# Patient Record
Sex: Male | Born: 1960 | Race: Black or African American | Hispanic: No | Marital: Married
Health system: Southern US, Community
[De-identification: ages and names within clinical notes are randomized; demographics above are authoritative.]

## PROBLEM LIST (undated history)

## (undated) DIAGNOSIS — J9621 Acute and chronic respiratory failure with hypoxia: Secondary | ICD-10-CM

## (undated) DIAGNOSIS — J449 Chronic obstructive pulmonary disease, unspecified: Secondary | ICD-10-CM

## (undated) DIAGNOSIS — G9341 Metabolic encephalopathy: Secondary | ICD-10-CM

## (undated) DIAGNOSIS — G40909 Epilepsy, unspecified, not intractable, without status epilepticus: Secondary | ICD-10-CM

## (undated) DIAGNOSIS — I482 Chronic atrial fibrillation, unspecified: Secondary | ICD-10-CM

---

## 2019-10-30 ENCOUNTER — Inpatient Hospital Stay
Admission: RE | Admit: 2019-10-30 | Discharge: 2019-12-31 | Disposition: A | Discharge status: Skilled Nursing Facility | Payer: BC Managed Care – PPO | Source: Ambulatory Visit | Attending: Internal Medicine | Admitting: Internal Medicine

## 2019-10-30 DIAGNOSIS — J939 Pneumothorax, unspecified: Secondary | ICD-10-CM

## 2019-10-30 DIAGNOSIS — G9341 Metabolic encephalopathy: Secondary | ICD-10-CM | POA: Diagnosis present

## 2019-10-30 DIAGNOSIS — I482 Chronic atrial fibrillation, unspecified: Secondary | ICD-10-CM | POA: Diagnosis present

## 2019-10-30 DIAGNOSIS — J942 Hemothorax: Secondary | ICD-10-CM

## 2019-10-30 DIAGNOSIS — Z9911 Dependence on respirator [ventilator] status: Secondary | ICD-10-CM

## 2019-10-30 DIAGNOSIS — J90 Pleural effusion, not elsewhere classified: Secondary | ICD-10-CM

## 2019-10-30 DIAGNOSIS — R7989 Other specified abnormal findings of blood chemistry: Secondary | ICD-10-CM

## 2019-10-30 DIAGNOSIS — R140 Abdominal distension (gaseous): Secondary | ICD-10-CM

## 2019-10-30 DIAGNOSIS — J9621 Acute and chronic respiratory failure with hypoxia: Secondary | ICD-10-CM | POA: Diagnosis present

## 2019-10-30 DIAGNOSIS — Z431 Encounter for attention to gastrostomy: Secondary | ICD-10-CM

## 2019-10-30 DIAGNOSIS — Z4659 Encounter for fitting and adjustment of other gastrointestinal appliance and device: Secondary | ICD-10-CM

## 2019-10-30 DIAGNOSIS — Z0189 Encounter for other specified special examinations: Secondary | ICD-10-CM

## 2019-10-30 DIAGNOSIS — R1915 Other abnormal bowel sounds: Secondary | ICD-10-CM

## 2019-10-30 DIAGNOSIS — J449 Chronic obstructive pulmonary disease, unspecified: Secondary | ICD-10-CM | POA: Diagnosis present

## 2019-10-30 DIAGNOSIS — R799 Abnormal finding of blood chemistry, unspecified: Secondary | ICD-10-CM

## 2019-10-30 DIAGNOSIS — R509 Fever, unspecified: Secondary | ICD-10-CM

## 2019-10-30 DIAGNOSIS — Z95828 Presence of other vascular implants and grafts: Secondary | ICD-10-CM

## 2019-10-30 DIAGNOSIS — J9691 Respiratory failure, unspecified with hypoxia: Secondary | ICD-10-CM

## 2019-10-30 DIAGNOSIS — Z9889 Other specified postprocedural states: Secondary | ICD-10-CM

## 2019-10-30 DIAGNOSIS — J9690 Respiratory failure, unspecified, unspecified whether with hypoxia or hypercapnia: Secondary | ICD-10-CM

## 2019-10-30 DIAGNOSIS — K567 Ileus, unspecified: Secondary | ICD-10-CM

## 2019-10-30 DIAGNOSIS — Z931 Gastrostomy status: Secondary | ICD-10-CM

## 2019-10-30 DIAGNOSIS — J189 Pneumonia, unspecified organism: Secondary | ICD-10-CM

## 2019-10-30 DIAGNOSIS — J811 Chronic pulmonary edema: Secondary | ICD-10-CM

## 2019-10-30 DIAGNOSIS — G40909 Epilepsy, unspecified, not intractable, without status epilepticus: Secondary | ICD-10-CM

## 2019-10-30 DIAGNOSIS — R609 Edema, unspecified: Secondary | ICD-10-CM

## 2019-10-30 DIAGNOSIS — R188 Other ascites: Secondary | ICD-10-CM

## 2019-10-30 HISTORY — DX: Acute and chronic respiratory failure with hypoxia: J96.21

## 2019-10-30 HISTORY — DX: Epilepsy, unspecified, not intractable, without status epilepticus: G40.909

## 2019-10-30 HISTORY — DX: Chronic obstructive pulmonary disease, unspecified: J44.9

## 2019-10-30 HISTORY — DX: Chronic atrial fibrillation, unspecified: I48.20

## 2019-10-30 HISTORY — DX: Metabolic encephalopathy: G93.41

## 2019-10-31 ENCOUNTER — Other Ambulatory Visit (HOSPITAL_COMMUNITY): Payer: BC Managed Care – PPO

## 2019-10-31 LAB — BLOOD GAS, ARTERIAL
Acid-Base Excess: 2.1 mmol/L — ABNORMAL HIGH (ref 0.0–2.0)
Bicarbonate: 26 mmol/L (ref 20.0–28.0)
FIO2: 40
O2 Saturation: 97.1 %
Patient temperature: 37.5
pCO2 arterial: 40.2 mmHg (ref 32.0–48.0)
pH, Arterial: 7.429 (ref 7.350–7.450)
pO2, Arterial: 95.6 mmHg (ref 83.0–108.0)

## 2019-10-31 LAB — COMPREHENSIVE METABOLIC PANEL
ALT: 34 U/L (ref 0–44)
AST: 29 U/L (ref 15–41)
Albumin: 1.9 g/dL — ABNORMAL LOW (ref 3.5–5.0)
Alkaline Phosphatase: 63 U/L (ref 38–126)
Anion gap: 9 (ref 5–15)
BUN: 13 mg/dL (ref 6–20)
CO2: 25 mmol/L (ref 22–32)
Calcium: 7.8 mg/dL — ABNORMAL LOW (ref 8.9–10.3)
Chloride: 112 mmol/L — ABNORMAL HIGH (ref 98–111)
Creatinine, Ser: 0.84 mg/dL (ref 0.61–1.24)
GFR calc Af Amer: >60 mL/min (ref >60)
GFR calc non Af Amer: >60 mL/min (ref >60)
Glucose, Bld: 107 mg/dL — ABNORMAL HIGH (ref 70–99)
Potassium: 4.1 mmol/L (ref 3.5–5.1)
Sodium: 146 mmol/L — ABNORMAL HIGH (ref 135–145)
Total Bilirubin: 0.5 mg/dL (ref 0.3–1.2)
Total Protein: 5.8 g/dL — ABNORMAL LOW (ref 6.5–8.1)

## 2019-10-31 LAB — URINALYSIS, ROUTINE W REFLEX MICROSCOPIC
Bacteria, UA: NONE SEEN
Bilirubin Urine: NEGATIVE
Glucose, UA: NEGATIVE mg/dL
Ketones, ur: 5 mg/dL — AB
Leukocytes,Ua: NEGATIVE
Nitrite: NEGATIVE
Protein, ur: 30 mg/dL — AB
Specific Gravity, Urine: 1.02 (ref 1.005–1.030)
pH: 6 (ref 5.0–8.0)

## 2019-10-31 LAB — CBC
HCT: 32.5 % — ABNORMAL LOW (ref 39.0–52.0)
Hemoglobin: 10.1 g/dL — ABNORMAL LOW (ref 13.0–17.0)
MCH: 28.5 pg (ref 26.0–34.0)
MCHC: 31.1 g/dL (ref 30.0–36.0)
MCV: 91.5 fL (ref 80.0–100.0)
Platelets: 377 10*3/uL (ref 150–400)
RBC: 3.55 MIL/uL — ABNORMAL LOW (ref 4.22–5.81)
RDW: 13 % (ref 11.5–15.5)
WBC: 15.4 10*3/uL — ABNORMAL HIGH (ref 4.0–10.5)
nRBC: 0 % (ref 0.0–0.2)

## 2019-10-31 LAB — PROTIME-INR
INR: 1.2 (ref 0.8–1.2)
Prothrombin Time: 15.1 seconds (ref 11.4–15.2)

## 2019-10-31 LAB — APTT: aPTT: 34 seconds (ref 24–36)

## 2019-11-01 ENCOUNTER — Other Ambulatory Visit (HOSPITAL_COMMUNITY): Payer: BC Managed Care – PPO

## 2019-11-01 LAB — CBC
HCT: 31.5 % — ABNORMAL LOW (ref 39.0–52.0)
Hemoglobin: 10 g/dL — ABNORMAL LOW (ref 13.0–17.0)
MCH: 28.8 pg (ref 26.0–34.0)
MCHC: 31.7 g/dL (ref 30.0–36.0)
MCV: 90.8 fL (ref 80.0–100.0)
Platelets: 358 10*3/uL (ref 150–400)
RBC: 3.47 MIL/uL — ABNORMAL LOW (ref 4.22–5.81)
RDW: 12.9 % (ref 11.5–15.5)
WBC: 13.5 10*3/uL — ABNORMAL HIGH (ref 4.0–10.5)
nRBC: 0 % (ref 0.0–0.2)

## 2019-11-01 LAB — HEMOGLOBIN A1C
Hgb A1c MFr Bld: 6.3 % — ABNORMAL HIGH (ref 4.8–5.6)
Mean Plasma Glucose: 134.11 mg/dL

## 2019-11-01 LAB — RENAL FUNCTION PANEL
Albumin: 1.8 g/dL — ABNORMAL LOW (ref 3.5–5.0)
Anion gap: 7 (ref 5–15)
BUN: 12 mg/dL (ref 6–20)
CO2: 25 mmol/L (ref 22–32)
Calcium: 7.6 mg/dL — ABNORMAL LOW (ref 8.9–10.3)
Chloride: 111 mmol/L (ref 98–111)
Creatinine, Ser: 0.87 mg/dL (ref 0.61–1.24)
GFR calc Af Amer: >60 mL/min (ref >60)
GFR calc non Af Amer: >60 mL/min (ref >60)
Glucose, Bld: 109 mg/dL — ABNORMAL HIGH (ref 70–99)
Phosphorus: 3.2 mg/dL (ref 2.5–4.6)
Potassium: 4.1 mmol/L (ref 3.5–5.1)
Sodium: 143 mmol/L (ref 135–145)

## 2019-11-01 LAB — TSH: TSH: 1.927 u[IU]/mL (ref 0.350–4.500)

## 2019-11-01 LAB — MAGNESIUM: Magnesium: 2 mg/dL (ref 1.7–2.4)

## 2019-11-01 LAB — T4, FREE: Free T4: 1.29 ng/dL — ABNORMAL HIGH (ref 0.61–1.12)

## 2019-11-01 LAB — AMMONIA: Ammonia: 59 umol/L — ABNORMAL HIGH (ref 9–35)

## 2019-11-01 NOTE — Consult Note (Addendum)
Chief Complaint: Patient was seen in consultation today for abdominal pain; gastrostomy tube malpositioned.   Referring Physician(s): Dr. Sharyon Medicus  Supervising Physician: Gilmer Mor  Patient Status: [facility] in-patient  History of Present Illness: Connor Roach is a 59 y.o. 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 The patient was transferred from [facility] in [place] to [facility] on 10/30/19. The patient was admitted to [facility] for acute respiratory failure. He received a gastrostomy tube and a trach while at that hospital. Upon arrival to [facility] the patient had abdominal pain. CT imaging was obtained.  CT abdomen/pelvis 11/01/19: IMPRESSION: 1. Gastrostomy tube has pulled back into the abdominal wall. This could be a source of pain. No bowel obstruction. 2. Foley catheter in the bladder.  No renal hydronephrosis. 3. Bibasilar atelectasis.  Interventional Radiology has been asked to evaluate this position for repositioning of his current gastrostomy tube or possible replacement.    Allergies: Patient has no allergy information on record. Please check [facility] chart for list of allergies.   Medications: Prior to Admission medications   Not on File    Social History   Socioeconomic History  . Marital status: Married    Spouse name: Not on file  . Number of children: Not on file  . Years of education: Not on file  . Highest education level: Not on file  Occupational History  . Not on file  Tobacco Use  . Smoking status: Not on file  Substance and Sexual Activity  . Alcohol use: Not on file  . Drug use: Not on file  . Sexual activity: Not on file  Other Topics Concern  . Not on file  Social History Narrative  . Not on file   Social Determinants of Health   Financial Resource Strain:   . Difficulty of Paying Living Expenses:  Not on file  Food Insecurity:   . Worried About Programme researcher, broadcasting/film/video in the Last Year: Not on file  . Ran Out of Food in the Last Year: Not on file  Transportation Needs:   . Lack of Transportation (Medical): Not on file  . Lack of Transportation (Non-Medical): Not on file  Physical Activity:   . Days of Exercise per Week: Not on file  . Minutes of Exercise per Session: Not on file  Stress:   . Feeling of Stress : Not on file  Social Connections:   . Frequency of Communication with Friends and Family: Not on file  . Frequency of Social Gatherings with Friends and Family: Not on file  . Attends Religious Services: Not on file  . Active Member of Clubs or Organizations: Not on file  . Attends Banker Meetings: Not on file  . Marital Status: Not on file    Review of Systems: A 12 point ROS discussed and pertinent positives are indicated in the HPI above.  All other systems are negative.  Review of Systems  Unable to perform ROS: Acuity of condition    Vital Signs: Temp: 99.8, HR 96, BP 156/75, RR 30, 100% O2 Ventilator  Physical Exam Constitutional:      General: He is not in acute distress.    Appearance: He is ill-appearing.     Comments: Patient with tracheostomy and on the ventilator. He appears uncomfortable.   HENT:     Mouth/Throat:     Mouth: Mucous membranes are dry.  Pharynx: Oropharynx is clear.  Cardiovascular:     Rate and Rhythm: Normal rate and regular rhythm.     Pulses: Normal pulses.     Heart sounds: Normal heart sounds.  Pulmonary:     Breath sounds: Rhonchi present.     Comments: Tracheostomy/ventilator Abdominal:     General: Bowel sounds are normal.     Palpations: Abdomen is soft.     Comments: Gastrostomy tube in place; split gauze dressing saturated with dark-colored blood.   Skin:    General: Skin is warm and dry.  Neurological:     Mental Status: He is alert.     Comments: Unable to assess. Patient did not give any signs  that he understood what I was saying.      Imaging: CT ABDOMEN PELVIS WO CONTRAST  Result Date: 11/01/2019 CLINICAL DATA:  Epigastric pain EXAM: CT ABDOMEN AND PELVIS WITHOUT CONTRAST TECHNIQUE: Multidetector CT imaging of the abdomen and pelvis was performed following the standard protocol without IV contrast. COMPARISON:  None. FINDINGS: Lower chest: Bibasilar atelectasis posteriorly. No pleural effusion. Heart size normal. Hepatobiliary: No focal liver abnormality is seen. No gallstones, gallbladder wall thickening, or biliary dilatation. Pancreas: Negative Spleen: Negative Adrenals/Urinary Tract: Adrenal glands are unremarkable. Kidneys are normal, without renal calculi, focal lesion, or hydronephrosis. Foley catheter in bladder. Bladder empty. Stomach/Bowel: Gastrostomy tube is noted. The mushroom for the catheter is in the anterior abdominal wall. The anterior wall the stomach protrudes into the and abdominal wall. No fluid collection or edema. No bowel obstruction. Negative for bowel mass or edema. Normal appendix. Vascular/Lymphatic: Mild atherosclerotic calcification in the aorta. No aneurysm. No enlarged lymph nodes. Reproductive: Prostate not enlarged. Other: No free fluid or abscess. Musculoskeletal: Negative IMPRESSION: 1. Gastrostomy tube has pulled back into the abdominal wall. This could be a source of pain. No bowel obstruction. 2. Foley catheter in the bladder.  No renal hydronephrosis. 3. Bibasilar atelectasis. Electronically Signed   By: Marlan Palau M.D.   On: 11/01/2019 12:57   DG Chest Port 1 View  Result Date: 10/31/2019 CLINICAL DATA:  Respiratory failure EXAM: PORTABLE CHEST 1 VIEW COMPARISON:  None. FINDINGS: The heart size and mediastinal contours are within normal limits. Both lungs are clear. The visualized skeletal structures are unremarkable. Tracheostomy tube tip at the level of the clavicular heads. Linear density to the left of the tracheal air column is likely  external. IMPRESSION: No active disease. Tracheostomy tube tip at the level of the clavicular heads. Electronically Signed   By: Deatra Robinson M.D.   On: 10/31/2019 02:08   DG Abd Portable 1V  Result Date: 10/31/2019 CLINICAL DATA:  PEG tube placement EXAM: PORTABLE ABDOMEN - 1 VIEW COMPARISON:  None. FINDINGS: Contrast injected through the gastrostomy tube outlines rugal folds, the duodenum and proximal jejunum. IMPRESSION: Gastrostomy tube tip in the stomach. Electronically Signed   By: Deatra Robinson M.D.   On: 10/31/2019 02:07    Labs:  CBC: Recent Labs    10/31/19 1115 11/01/19 0720  WBC 15.4* 13.5*  HGB 10.1* 10.0*  HCT 32.5* 31.5*  PLT 377 358    COAGS: Recent Labs    10/31/19 1115  INR 1.2  APTT 34    BMP: Recent Labs    10/31/19 1115 11/01/19 0720  NA 146* 143  K 4.1 4.1  CL 112* 111  CO2 25 25  GLUCOSE 107* 109*  BUN 13 12  CALCIUM 7.8* 7.6*  CREATININE 0.84 0.87  GFRNONAA >60 >  60  GFRAA >60 >60    LIVER FUNCTION TESTS: Recent Labs    10/31/19 1115 11/01/19 0720  BILITOT 0.5  --   AST 29  --   ALT 34  --   ALKPHOS 63  --   PROT 5.8*  --   ALBUMIN 1.9* 1.8*    TUMOR MARKERS: No results for input(s): AFPTM, CEA, CA199, CHROMGRNA in the last 8760 hours.  Assessment and Plan:  Acute respiratory failure; dysphagia; malpositioned gastrostomy tube: Connor Roach, 59 year old male, is tentatively scheduled to be seen 11/02/19 at the [facility] Interventional Radiology department for repositioning of his current gastrostomy tube or possible replacement. Telephone consent obtained from patient's wife Dravon Nott.   Risks and benefits image guided gastrostomy tube repositiong or replacement were discussed with the patient's wife including, but not limited to the need for a barium enema during the procedure, bleeding, infection, peritonitis and/or damage to adjacent structures.  All of the patient's wife's questions were answered, patient's wife is  agreeable to proceed.  Bedside RN at [facility] instructed to NOT use current gastrostomy tube and to hold lovenox in preparation for IR procedure.   Consent signed and in the APP office at [facility].  Thank you for this interesting consult.  I greatly enjoyed meeting JERY HOLLERN and look forward to participating in their care.  A copy of this report was sent to the requesting provider on this date.  Electronically Signed: Alwyn Ren, AGACNP-BC [phone] 11/01/2019, 4:36 PM   I spent a total of 40 Minutes    in face to face in clinical consultation, greater than 50% of which was counseling/coordinating care for gastrostomy tube reposition or replacement.

## 2019-11-01 NOTE — Consult Note (Signed)
Infectious Disease Consultation   Connor Roach  HAL:[id number]  DOB: [birth year]  DOA: 10/30/2019  Requesting physician: Dr. Manson Passey  Reason for consultation: Antibiotic recommendations   History of Present Illness: Connor Roach is an 59 y.o. male who [facility] 10/30/[facility].  He has a medical history significant of bipolar disorder, polysubstance abuse.  He was initially admitted to the acute hospital on 10/13/2019 due to encephalopathy secondary to medication toxicity.  He was found to have acute respiratory failure with hypercapnia.  He was initially unresponsive and had to be intubated.  Patient at that time also had elevated WBC count of 26.9 and creatinine of 1.52.  Urine toxicology screen was positive for cocaine and benzodiazepines.  He was treated for possible sepsis, respiratory failure.  Per records from the outside facility he developed angioedema which was treated and he also had paroxysmal atrial fibrillation and was treated.  Patient's hospital course complicated by seizures and developed on 10/16/2019 likely secondary to withdrawal?  He was placed on seizure medications.  He had multiple electrolyte abnormalities that were corrected.  He had tracheostomy as well as PEG tube placement.  After admission here he was found to have fever and leukocytosis.  He was started on empiric IV vancomycin, cefepime.  Blood cultures, respiratory cultures pending at this time.  Patient apparently also was found to have stool leaking through his PEG tube?  CT of the abdomen and pelvis without contrast was done which showed that the gastrostomy tube pulled back into the abdominal wall.  No bowel obstruction.  Bibasilar atelectasis was noted.  He is encephalopathic.  He currently has a trach in place.  On 40% FiO2.  Review of Systems:  Unable to obtain review of systems.  Patient nonverbal, encephalopathy.  Past Medical History: Substance abuse, bipolar disorder, anxiety, COPD,  anemia, hypertension  Past Surgical History: Trach, PEG tube placement  Allergies: No known drug allergies  Social History: History of polysubstance abuse, tobacco abuse, alcohol use  Family History: Unable to obtain  Physical Exam: Vitals: Temperature 99.8, pulse 72, respiratory rate 18, blood pressure 156/75, pulse oximetry 100% Constitutional: Ill-appearing male, opening eyes but not following any commands at this time.,  Encephalopathy. Eyes: PERLA, EOMI  ENMT: external ears and nose appear normal, poor dentition Neck: Has trach in place CVS: S1-S2 Respiratory: Rhonchi, no wheezing Abdomen: Soft, has PEG tube in place with dark/black drainage from around the PEG tube, positive bowel sounds  Musculoskeletal: No edema Neuro: He is not following any commands.  Unable to do neurologic exam at this time. Psych: Unable to assess Skin: no rashes  Data reviewed:  I have personally reviewed following labs and imaging studies Labs:  CBC: Recent Labs  Lab 10/31/19 1115 11/01/19 0720  WBC 15.4* 13.5*  HGB 10.1* 10.0*  HCT 32.5* 31.5*  MCV 91.5 90.8  PLT 377 358    Basic Metabolic Panel: Recent Labs  Lab 10/31/19 1115 11/01/19 0720  NA 146* 143  K 4.1 4.1  CL 112* 111  CO2 25 25  GLUCOSE 107* 109*  BUN 13 12  CREATININE 0.84 0.87  CALCIUM 7.8* 7.6*  MG  --  2.0  PHOS  --  3.2   GFR CrCl cannot be calculated (Unknown ideal weight.). Liver Function Tests: Recent Labs  Lab 10/31/19 1115 11/01/19 0720  AST 29  --   ALT 34  --   ALKPHOS 63  --  BILITOT 0.5  --   PROT 5.8*  --   ALBUMIN 1.9* 1.8*   No results for input(s): LIPASE, AMYLASE in the last 168 hours. Recent Labs  Lab 11/01/19 0720  AMMONIA 59*   Coagulation profile Recent Labs  Lab 10/31/19 1115  INR 1.2    Cardiac Enzymes: No results for input(s): CKTOTAL, CKMB, CKMBINDEX, TROPONINI in the last 168 hours. BNP: Invalid input(s): POCBNP CBG: No results for input(s): GLUCAP in the  last 168 hours. D-Dimer No results for input(s): DDIMER in the last 72 hours. Hgb A1c Recent Labs    11/01/19 0720  HGBA1C 6.3*   Lipid Profile No results for input(s): CHOL, HDL, LDLCALC, TRIG, CHOLHDL, LDLDIRECT in the last 72 hours. Thyroid function studies Recent Labs    11/01/19 0720  TSH 1.927   Anemia work up No results for input(s): VITAMINB12, FOLATE, FERRITIN, TIBC, IRON, RETICCTPCT in the last 72 hours. Urinalysis    Component Value Date/Time   COLORURINE YELLOW 10/31/2019 1711   APPEARANCEUR CLEAR 10/31/2019 1711   LABSPEC 1.020 10/31/2019 1711   PHURINE 6.0 10/31/2019 1711   GLUCOSEU NEGATIVE 10/31/2019 1711   HGBUR MODERATE (A) 10/31/2019 1711   BILIRUBINUR NEGATIVE 10/31/2019 1711   KETONESUR 5 (A) 10/31/2019 1711   PROTEINUR 30 (A) 10/31/2019 1711   NITRITE NEGATIVE 10/31/2019 1711   LEUKOCYTESUR NEGATIVE 10/31/2019 1711     Microbiology Recent Results (from the past 240 hour(s))  Culture, respiratory (non-expectorated)     Status: None (Preliminary result)   Collection Time: 10/31/19  4:15 AM   Specimen: Tracheal Aspirate; Respiratory  Result Value Ref Range Status   Specimen Description TRACHEAL ASPIRATE  Final   Special Requests Normal  Final   Gram Stain   Final    FEW WBC PRESENT, PREDOMINANTLY PMN MODERATE GRAM POSITIVE COCCI    Culture   Final    ABUNDANT STAPHYLOCOCCUS AUREUS SUSCEPTIBILITIES TO FOLLOW Performed at [facility], [address], [place], [place] [address]    Report Status PENDING  Incomplete       Inpatient Medications:   Please see MAR   Radiological Exams on Admission: CT ABDOMEN PELVIS WO CONTRAST  Result Date: 11/01/2019 CLINICAL DATA:  Epigastric pain EXAM: CT ABDOMEN AND PELVIS WITHOUT CONTRAST TECHNIQUE: Multidetector CT imaging of the abdomen and pelvis was performed following the standard protocol without IV contrast. COMPARISON:  None. FINDINGS: Lower chest: Bibasilar atelectasis posteriorly. No  pleural effusion. Heart size normal. Hepatobiliary: No focal liver abnormality is seen. No gallstones, gallbladder wall thickening, or biliary dilatation. Pancreas: Negative Spleen: Negative Adrenals/Urinary Tract: Adrenal glands are unremarkable. Kidneys are normal, without renal calculi, focal lesion, or hydronephrosis. Foley catheter in bladder. Bladder empty. Stomach/Bowel: Gastrostomy tube is noted. The mushroom for the catheter is in the anterior abdominal wall. The anterior wall the stomach protrudes into the and abdominal wall. No fluid collection or edema. No bowel obstruction. Negative for bowel mass or edema. Normal appendix. Vascular/Lymphatic: Mild atherosclerotic calcification in the aorta. No aneurysm. No enlarged lymph nodes. Reproductive: Prostate not enlarged. Other: No free fluid or abscess. Musculoskeletal: Negative IMPRESSION: 1. Gastrostomy tube has pulled back into the abdominal wall. This could be a source of pain. No bowel obstruction. 2. Foley catheter in the bladder.  No renal hydronephrosis. 3. Bibasilar atelectasis. Electronically Signed   By: Marlan Palau M.D.   On: 11/01/2019 12:57   DG Chest Port 1 View  Result Date: 10/31/2019 CLINICAL DATA:  Respiratory failure EXAM: PORTABLE CHEST  1 VIEW COMPARISON:  None. FINDINGS: The heart size and mediastinal contours are within normal limits. Both lungs are clear. The visualized skeletal structures are unremarkable. Tracheostomy tube tip at the level of the clavicular heads. Linear density to the left of the tracheal air column is likely external. IMPRESSION: No active disease. Tracheostomy tube tip at the level of the clavicular heads. Electronically Signed   By: Deatra RobinsonKevin  Herman M.D.   On: 10/31/2019 02:08   DG Abd Portable 1V  Result Date: 10/31/2019 CLINICAL DATA:  PEG tube placement EXAM: PORTABLE ABDOMEN - 1 VIEW COMPARISON:  None. FINDINGS: Contrast injected through the gastrostomy tube outlines rugal folds, the duodenum and  proximal jejunum. IMPRESSION: Gastrostomy tube tip in the stomach. Electronically Signed   By: Deatra RobinsonKevin  Herman M.D.   On: 10/31/2019 02:07    Impression/Recommendations Acute hypoxemic/hypercapnic respiratory failure, ventilator dependent respiratory failure Systemic inflammatory response syndrome Fever/leukocytosis PEG tube site complication with leakage Atrial fibrillation Encephalopathy Seizure disorder Dysphagia Polysubstance abuse  Acute hypoxemic/hypercapnic respiratory failure: Patient currently ventilator dependent.  On 40% FiO2.  He initially was found unresponsive probably from polysubstance abuse/intoxication and had to be intubated for respiratory protection.  Currently has a trach in place.  He is high risk for aspiration and aspiration pneumonia.  Preliminary respiratory cultures showing Staphylococcus, susceptibilities still pending at this time.  Currently started on IV vancomycin, cefepime and empirically due to systemic inflammatory response syndrome fever, worsening leukocytosis.  Chest x-ray did not show any acute findings.  However, he could be having early pneumonia.  Follow-up on the final respiratory cultures and adjust antibiotics accordingly.  Systemic inflammatory response syndrome: Patient having fever, leukocytosis.  Started on empiric antibiotic treatment with IV vancomycin, cefepime.  Would also recommend to add Flagyl for anaerobic coverage.  He has some dark/black-colored material draining from around his PEG tube.  Initially thought to be fecal matter.  CT of the abdomen and pelvis showing that the PEG tube was displaced into the abdominal wall.  He is high risk for abdominal wall cellulitis/abscess.  Empiric antibiotics as mentioned above.  Continue to monitor counts closely.  He also had acute renal failure at the outside hospital likely secondary to sepsis.  Please monitor BUN/trending closely while on antibiotics.  Fever/leukocytosis: Patient noted to have  elevated WBC count, fever.  Currently started on empiric IV vancomycin, cefepime.  Blood cultures and respiratory cultures ordered.  Preliminary respiratory cultures showing Staphylococcus.  Susceptibilities still pending at this time.  Chest x-ray negative.  It is possible that he could be having early pneumonia.  There is also concern for infection around the PEG tube site with drainage.  CT abdomen imaging as mentioned above.  Recommended to add Flagyl for anaerobic coverage. Continue to monitor closely.  PEG tube site complication with leakage: Previously thought to be fecal matter.  CT of the abdomen pelvis without contrast was done with findings as mentioned above.  He appears to have dark/almost black-colored drainage from around the PEG tube.  Concern for GI bleed?  Given history of polysubstance abuse, alcohol use.  Suggest to check stool for occult blood.  He is also high risk for abdominal wall cellulitis/abscess due to the displacement of the PEG tube. Primary team consulting interventional radiology. Further management per the primary team.   Atrial fibrillation: Continue medication and management per the primary team.  Seizure: There is a mention of seizures at the outside facility could be possibly secondary to withdrawal.  However, antibiotics lower the seizure  threshold therefore monitor closely.  Continue medication for seizures per the primary team.  Encephalopathy: Continue supportive management per the primary team.  Dysphagia: Due to his dysphagia he is high risk for aspiration and aspiration pneumonia.  Polysubstance abuse: Patient is at risk for withdrawal secondary to polysubstance abuse.  He is also at risk for bacteremia/endocarditis due to his drug abuse.  Currently started on empiric antibiotics.  Blood cultures, respiratory cultures pending at this time.  Continue supportive management per the primary team.  Due to his complex medical problems he is high risk for worsening  and decompensation.  Thank you for this consultation.  Plan of care discussed with the primary team and pharmacy.  Vonzella Nipple M.D. 11/01/2019, 4:54 PM

## 2019-11-02 ENCOUNTER — Other Ambulatory Visit (HOSPITAL_COMMUNITY): Payer: BC Managed Care – PPO

## 2019-11-02 LAB — CULTURE, RESPIRATORY W GRAM STAIN: Special Requests: NORMAL

## 2019-11-02 LAB — BASIC METABOLIC PANEL
Anion gap: 10 (ref 5–15)
BUN: 12 mg/dL (ref 6–20)
CO2: 22 mmol/L (ref 22–32)
Calcium: 7.6 mg/dL — ABNORMAL LOW (ref 8.9–10.3)
Chloride: 111 mmol/L (ref 98–111)
Creatinine, Ser: 0.91 mg/dL (ref 0.61–1.24)
GFR calc Af Amer: >60 mL/min (ref >60)
GFR calc non Af Amer: >60 mL/min (ref >60)
Glucose, Bld: 91 mg/dL (ref 70–99)
Potassium: 4 mmol/L (ref 3.5–5.1)
Sodium: 143 mmol/L (ref 135–145)

## 2019-11-02 LAB — CBC
HCT: 32.1 % — ABNORMAL LOW (ref 39.0–52.0)
Hemoglobin: 9.9 g/dL — ABNORMAL LOW (ref 13.0–17.0)
MCH: 28.1 pg (ref 26.0–34.0)
MCHC: 30.8 g/dL (ref 30.0–36.0)
MCV: 91.2 fL (ref 80.0–100.0)
Platelets: 376 10*3/uL (ref 150–400)
RBC: 3.52 MIL/uL — ABNORMAL LOW (ref 4.22–5.81)
RDW: 13.1 % (ref 11.5–15.5)
WBC: 11.1 10*3/uL — ABNORMAL HIGH (ref 4.0–10.5)
nRBC: 0 % (ref 0.0–0.2)

## 2019-11-02 LAB — URINE CULTURE: Culture: NO GROWTH

## 2019-11-02 LAB — PHOSPHORUS: Phosphorus: 3.2 mg/dL (ref 2.5–4.6)

## 2019-11-02 LAB — MAGNESIUM: Magnesium: 2 mg/dL (ref 1.7–2.4)

## 2019-11-02 LAB — VALPROIC ACID LEVEL: Valproic Acid Lvl: 55 ug/mL (ref 50.0–100.0)

## 2019-11-03 ENCOUNTER — Other Ambulatory Visit (HOSPITAL_COMMUNITY): Payer: BC Managed Care – PPO

## 2019-11-03 LAB — BASIC METABOLIC PANEL
Anion gap: 11 (ref 5–15)
BUN: 17 mg/dL (ref 6–20)
CO2: 25 mmol/L (ref 22–32)
Calcium: 7.7 mg/dL — ABNORMAL LOW (ref 8.9–10.3)
Chloride: 110 mmol/L (ref 98–111)
Creatinine, Ser: 0.86 mg/dL (ref 0.61–1.24)
GFR calc Af Amer: >60 mL/min (ref >60)
GFR calc non Af Amer: >60 mL/min (ref >60)
Glucose, Bld: 92 mg/dL (ref 70–99)
Potassium: 4.3 mmol/L (ref 3.5–5.1)
Sodium: 146 mmol/L — ABNORMAL HIGH (ref 135–145)

## 2019-11-03 LAB — CBC
HCT: 35.1 % — ABNORMAL LOW (ref 39.0–52.0)
Hemoglobin: 10.7 g/dL — ABNORMAL LOW (ref 13.0–17.0)
MCH: 28.2 pg (ref 26.0–34.0)
MCHC: 30.5 g/dL (ref 30.0–36.0)
MCV: 92.6 fL (ref 80.0–100.0)
Platelets: 371 10*3/uL (ref 150–400)
RBC: 3.79 MIL/uL — ABNORMAL LOW (ref 4.22–5.81)
RDW: 13.1 % (ref 11.5–15.5)
WBC: 14.7 10*3/uL — ABNORMAL HIGH (ref 4.0–10.5)
nRBC: 0 % (ref 0.0–0.2)

## 2019-11-03 LAB — BLOOD GAS, ARTERIAL
Acid-Base Excess: 3.2 mmol/L — ABNORMAL HIGH (ref 0.0–2.0)
Bicarbonate: 27.7 mmol/L (ref 20.0–28.0)
FIO2: 40
O2 Saturation: 96.9 %
Patient temperature: 37.1
pCO2 arterial: 45.7 mmHg (ref 32.0–48.0)
pH, Arterial: 7.399 (ref 7.350–7.450)
pO2, Arterial: 88.8 mmHg (ref 83.0–108.0)

## 2019-11-03 LAB — VANCOMYCIN, TROUGH: Vancomycin Tr: 16 ug/mL (ref 15–20)

## 2019-11-03 LAB — MAGNESIUM: Magnesium: 2.2 mg/dL (ref 1.7–2.4)

## 2019-11-03 LAB — PHOSPHORUS: Phosphorus: 4.1 mg/dL (ref 2.5–4.6)

## 2019-11-04 ENCOUNTER — Encounter: Payer: Self-pay | Admitting: Internal Medicine

## 2019-11-04 ENCOUNTER — Other Ambulatory Visit (HOSPITAL_COMMUNITY): Payer: BC Managed Care – PPO

## 2019-11-04 DIAGNOSIS — I482 Chronic atrial fibrillation, unspecified: Secondary | ICD-10-CM

## 2019-11-04 DIAGNOSIS — J9621 Acute and chronic respiratory failure with hypoxia: Secondary | ICD-10-CM | POA: Diagnosis present

## 2019-11-04 DIAGNOSIS — J449 Chronic obstructive pulmonary disease, unspecified: Secondary | ICD-10-CM

## 2019-11-04 DIAGNOSIS — G9341 Metabolic encephalopathy: Secondary | ICD-10-CM | POA: Diagnosis present

## 2019-11-04 DIAGNOSIS — G40909 Epilepsy, unspecified, not intractable, without status epilepticus: Secondary | ICD-10-CM

## 2019-11-04 LAB — BASIC METABOLIC PANEL
Anion gap: 11 (ref 5–15)
BUN: 32 mg/dL — ABNORMAL HIGH (ref 6–20)
CO2: 23 mmol/L (ref 22–32)
Calcium: 7.6 mg/dL — ABNORMAL LOW (ref 8.9–10.3)
Chloride: 112 mmol/L — ABNORMAL HIGH (ref 98–111)
Creatinine, Ser: 0.98 mg/dL (ref 0.61–1.24)
GFR calc Af Amer: >60 mL/min (ref >60)
GFR calc non Af Amer: >60 mL/min (ref >60)
Glucose, Bld: 102 mg/dL — ABNORMAL HIGH (ref 70–99)
Potassium: 5.4 mmol/L — ABNORMAL HIGH (ref 3.5–5.1)
Sodium: 146 mmol/L — ABNORMAL HIGH (ref 135–145)

## 2019-11-04 LAB — CBC
HCT: 35.3 % — ABNORMAL LOW (ref 39.0–52.0)
Hemoglobin: 10.5 g/dL — ABNORMAL LOW (ref 13.0–17.0)
MCH: 29.4 pg (ref 26.0–34.0)
MCHC: 29.7 g/dL — ABNORMAL LOW (ref 30.0–36.0)
MCV: 98.9 fL (ref 80.0–100.0)
Platelets: 351 10*3/uL (ref 150–400)
RBC: 3.57 MIL/uL — ABNORMAL LOW (ref 4.22–5.81)
RDW: 13.3 % (ref 11.5–15.5)
WBC: 13.2 10*3/uL — ABNORMAL HIGH (ref 4.0–10.5)
nRBC: 0 % (ref 0.0–0.2)

## 2019-11-04 LAB — PHOSPHORUS: Phosphorus: 4.1 mg/dL (ref 2.5–4.6)

## 2019-11-04 LAB — MAGNESIUM: Magnesium: 2.5 mg/dL — ABNORMAL HIGH (ref 1.7–2.4)

## 2019-11-04 LAB — TRIGLYCERIDES: Triglycerides: 126 mg/dL (ref <150)

## 2019-11-04 NOTE — Consult Note (Addendum)
Pulmonary Critical Care Medicine [facility] [place]  PULMONARY SERVICE  Date of Service: 11/04/2019  PULMONARY CRITICAL CARE CONSULT   [name]  ZOX:[id number]  DOB: October 19, 1960   DOA: 10/30/2019  Referring Physician: Carron Curie, MD  HPI: Connor Roach is a 59 y.o. male seen for follow up of Acute on Chronic Respiratory Failure. Patient has multiple medical problems including bipolar disorder atrial fibrillation polysubstance abuse flaccid paralysis came into the hospital originally with acute respiratory failure. He had been apparently found unresponsive and was also noted to have positive cocaine and benzodiazepines. Patient was felt to be septic at the time of admission. Apparently developed angioedema atrial fibrillation. Patient also subsequently developed seizures which was felt to be possibly related to withdrawal. Patient was started on IV antibiotics. He was however not able to wean off the ventilator. Transferred to our facility for further management and weaning.  Review of Systems:  ROS performed and is unremarkable other than noted above.  Past medical history: Polysubstance abuse Bipolar disorder Atrial fibrillation Seizure disorder COPD Anxiety disorder  Past surgical history: Tracheostomy PEG  Family history: Noncontributory to the present illness  Social history: Positive for tobacco use Alcohol use Polysubstance abuse  Medications: Reviewed on Rounds  Physical Exam:  Vitals: Temperature is 98.9 pulse 77 respiratory rate 13 blood pressure is 146/68 saturations 100%  Ventilator Settings patient currently is on assist control FiO2 40% tidal volume is 850 PEEP 5  . General: Comfortable at this time . Eyes: Grossly normal lids, irises & conjunctiva . ENT: grossly tongue is normal . Neck: no obvious mass . Cardiovascular: S1-S2 normal no gallop or rub . Respiratory: No rhonchi very coarse breath sounds . Abdomen: Soft and  nontender . Skin: no rash seen on limited exam . Musculoskeletal: not rigid . Psychiatric:unable to assess . Neurologic: no seizure no involuntary movements         Labs on Admission:  Basic Metabolic Panel: Recent Labs  Lab 10/31/19 1115 11/01/19 0720 11/02/19 0548 11/03/19 0723  NA 146* 143 143 146*  K 4.1 4.1 4.0 4.3  CL 112* 111 111 110  CO2 25 25 22 25   GLUCOSE 107* 109* 91 92  BUN 13 12 12 17   CREATININE 0.84 0.87 0.91 0.86  CALCIUM 7.8* 7.6* 7.6* 7.7*  MG  --  2.0 2.0 2.2  PHOS  --  3.2 3.2 4.1    Recent Labs  Lab 10/31/19 0030 11/03/19 0302  PHART 7.429 7.399  PCO2ART 40.2 45.7  PO2ART 95.6 88.8  HCO3 26.0 27.7  O2SAT 97.1 96.9    Liver Function Tests: Recent Labs  Lab 10/31/19 1115 11/01/19 0720  AST 29  --   ALT 34  --   ALKPHOS 63  --   BILITOT 0.5  --   PROT 5.8*  --   ALBUMIN 1.9* 1.8*   No results for input(s): LIPASE, AMYLASE in the last 168 hours. Recent Labs  Lab 11/01/19 0720  AMMONIA 59*    CBC: Recent Labs  Lab 10/31/19 1115 11/01/19 0720 11/02/19 0548 11/03/19 0723  WBC 15.4* 13.5* 11.1* 14.7*  HGB 10.1* 10.0* 9.9* 10.7*  HCT 32.5* 31.5* 32.1* 35.1*  MCV 91.5 90.8 91.2 92.6  PLT 377 358 376 371    Cardiac Enzymes: No results for input(s): CKTOTAL, CKMB, CKMBINDEX, TROPONINI in the last 168 hours.  BNP (last 3 results) No results for input(s): BNP in the last 8760 hours.  ProBNP (last 3 results) No results for  input(s): PROBNP in the last 8760 hours.   Radiological Exams on Admission: CT ABDOMEN PELVIS WO CONTRAST  Result Date: 11/01/2019 CLINICAL DATA:  Epigastric pain EXAM: CT ABDOMEN AND PELVIS WITHOUT CONTRAST TECHNIQUE: Multidetector CT imaging of the abdomen and pelvis was performed following the standard protocol without IV contrast. COMPARISON:  None. FINDINGS: Lower chest: Bibasilar atelectasis posteriorly. No pleural effusion. Heart size normal. Hepatobiliary: No focal liver abnormality is seen. No  gallstones, gallbladder wall thickening, or biliary dilatation. Pancreas: Negative Spleen: Negative Adrenals/Urinary Tract: Adrenal glands are unremarkable. Kidneys are normal, without renal calculi, focal lesion, or hydronephrosis. Foley catheter in bladder. Bladder empty. Stomach/Bowel: Gastrostomy tube is noted. The mushroom for the catheter is in the anterior abdominal wall. The anterior wall the stomach protrudes into the and abdominal wall. No fluid collection or edema. No bowel obstruction. Negative for bowel mass or edema. Normal appendix. Vascular/Lymphatic: Mild atherosclerotic calcification in the aorta. No aneurysm. No enlarged lymph nodes. Reproductive: Prostate not enlarged. Other: No free fluid or abscess. Musculoskeletal: Negative IMPRESSION: 1. Gastrostomy tube has pulled back into the abdominal wall. This could be a source of pain. No bowel obstruction. 2. Foley catheter in the bladder.  No renal hydronephrosis. 3. Bibasilar atelectasis. Electronically Signed   By: Marlan Palau M.D.   On: 11/01/2019 12:57   DG Abd 1 View  Result Date: 11/02/2019 CLINICAL DATA:  NG tube placement EXAM: ABDOMEN - 1 VIEW COMPARISON:  October 31, 2019 FINDINGS: A PEG tube is identified, unchanged since October 31, 2019. An OG tube is been placed in the interval and terminates in the right mid abdomen, either in the distal stomach or proximal duodenum. Contrast is seen throughout the length of the colon. No other acute abnormalities. IMPRESSION: 1. A PEG tube is again noted as above. 2. A new OG tube is identified. The distal tip is in the region of the distal stomach or proximal duodenum. Electronically Signed   By: Gerome Sam III M.D   On: 11/02/2019 15:00   DG CHEST PORT 1 VIEW  Result Date: 11/04/2019 CLINICAL DATA:  Pneumonia. EXAM: PORTABLE CHEST 1 VIEW COMPARISON:  11/03/2018 FINDINGS: Tracheostomy tube tip is stable above the carina. NG tube tip is below the GE junction. Normal heart size. No  pleural effusion or edema. No change in aeration to lungs compared with previous exam. IMPRESSION: No change in aeration to the lungs compared with previous exam. Stable tracheostomy tube and NG tube. Electronically Signed   By: Signa Kell M.D.   On: 11/04/2019 07:39   DG CHEST PORT 1 VIEW  Result Date: 11/03/2019 CLINICAL DATA:  Evaluate tracheostomy tube tip. EXAM: PORTABLE CHEST 1 VIEW COMPARISON:  10/31/2019 FINDINGS: Satisfactory position of tracheostomy tube with tip above the carina. There is a nasogastric tube with tip below the level of the GE junction. Heart size appears within normal limits. No pleural effusion or edema identified. No airspace densities. IMPRESSION: Satisfactory position of tracheostomy tube with tip above the carina. Electronically Signed   By: Signa Kell M.D.   On: 11/03/2019 08:57    Assessment/Plan Active Problems:   Acute on chronic respiratory failure with hypoxia (HCC)   Chronic atrial fibrillation (HCC)   Seizure disorder (HCC)   Metabolic encephalopathy   COPD, severe (HCC)   1. Acute on chronic respiratory failure with hypoxia we will continue with full support right now patient is on assist control. Wean protocol has been started 2. Atrial fibrillation rate now rate controlled we will  continue with supportive care. 3. Seizure disorder no active seizure noted at this time we will continue to monitor. 4. Severe COPD medical management nebulizers as necessary. 5. Metabolic encephalopathy we will continue to monitor closely.  I have personally seen and evaluated the patient, evaluated laboratory and imaging results, formulated the assessment and plan and placed orders. The Patient requires high complexity decision making with multiple systems involvement.  Case was discussed on Rounds with the Respiratory Therapy Director and the Respiratory staff Time Spent  Yevonne Pax, MD [facility] Pulmonary Critical Care Medicine Sleep Medicine

## 2019-11-05 ENCOUNTER — Other Ambulatory Visit (HOSPITAL_COMMUNITY): Payer: BC Managed Care – PPO

## 2019-11-05 DIAGNOSIS — J449 Chronic obstructive pulmonary disease, unspecified: Secondary | ICD-10-CM | POA: Diagnosis not present

## 2019-11-05 DIAGNOSIS — J9621 Acute and chronic respiratory failure with hypoxia: Secondary | ICD-10-CM | POA: Diagnosis not present

## 2019-11-05 DIAGNOSIS — J9620 Acute and chronic respiratory failure, unspecified whether with hypoxia or hypercapnia: Secondary | ICD-10-CM

## 2019-11-05 DIAGNOSIS — I482 Chronic atrial fibrillation, unspecified: Secondary | ICD-10-CM | POA: Diagnosis not present

## 2019-11-05 DIAGNOSIS — G9341 Metabolic encephalopathy: Secondary | ICD-10-CM | POA: Diagnosis not present

## 2019-11-05 LAB — CBC
HCT: 27.9 % — ABNORMAL LOW (ref 39.0–52.0)
Hemoglobin: 8.7 g/dL — ABNORMAL LOW (ref 13.0–17.0)
MCH: 29.5 pg (ref 26.0–34.0)
MCHC: 31.2 g/dL (ref 30.0–36.0)
MCV: 94.6 fL (ref 80.0–100.0)
Platelets: 270 10*3/uL (ref 150–400)
RBC: 2.95 MIL/uL — ABNORMAL LOW (ref 4.22–5.81)
RDW: 13.6 % (ref 11.5–15.5)
WBC: 14 10*3/uL — ABNORMAL HIGH (ref 4.0–10.5)
nRBC: 0 % (ref 0.0–0.2)

## 2019-11-05 LAB — RENAL FUNCTION PANEL
Albumin: 1.6 g/dL — ABNORMAL LOW (ref 3.5–5.0)
Anion gap: 12 (ref 5–15)
BUN: 51 mg/dL — ABNORMAL HIGH (ref 6–20)
CO2: 21 mmol/L — ABNORMAL LOW (ref 22–32)
Calcium: 7.6 mg/dL — ABNORMAL LOW (ref 8.9–10.3)
Chloride: 112 mmol/L — ABNORMAL HIGH (ref 98–111)
Creatinine, Ser: 1.2 mg/dL (ref 0.61–1.24)
GFR calc Af Amer: >60 mL/min (ref >60)
GFR calc non Af Amer: >60 mL/min (ref >60)
Glucose, Bld: 130 mg/dL — ABNORMAL HIGH (ref 70–99)
Phosphorus: 4.5 mg/dL (ref 2.5–4.6)
Potassium: 4.2 mmol/L (ref 3.5–5.1)
Sodium: 145 mmol/L (ref 135–145)

## 2019-11-05 LAB — LEVETIRACETAM LEVEL: Levetiracetam Lvl: 17.1 ug/mL (ref 10.0–40.0)

## 2019-11-05 LAB — PREPARE RBC (CROSSMATCH)

## 2019-11-05 LAB — MAGNESIUM: Magnesium: 2.6 mg/dL — ABNORMAL HIGH (ref 1.7–2.4)

## 2019-11-05 LAB — ABO/RH: ABO/RH(D): B POS

## 2019-11-05 MED ORDER — LIDOCAINE VISCOUS HCL 2 % MT SOLN
OROMUCOSAL | Status: AC
  Filled 2019-11-05: qty 15

## 2019-11-05 NOTE — Progress Notes (Signed)
Referring Physician(s): Dr. Sharyon Medicus  Supervising Physician: Irish Lack  Patient Status:  [facility] - inpatient   Chief Complaint: Gastrostomy tube displacement  Subjective: Interventional Radiology consulted 11/01/19 for assistance with repositioning/replacing current gastrostomy tube which has been pulled into the the abdominal wall.  Patient was scheduled to be seen in IR 11/02/19 for this procedure but he spiked a fever during the night and had to be placed on a cooling blanket. The procedure was postponed and a cortrak was placed for nutrition. The patient continued to have large amount of drainage from around gastrostomy tube, causing diffuse excoriation/cellulitis.   Allergies: Patient has no allergy information on record.  Medications: Prior to Admission medications   Not on File     Vital Signs: Temp: 98.2. HR 78. BP 95/54. RR 16. O2 100% on Ventilator.   Physical Exam Constitutional:      General: He is not in acute distress.    Appearance: He is ill-appearing.     Comments: Eyes open. Minimally responsive to physical and verbal stimuli.   Cardiovascular:     Rate and Rhythm: Normal rate and regular rhythm.  Pulmonary:     Comments: Tracheostomy/ventilator Abdominal:     Comments: Gastrostomy tube with white bumper visible. Copious dark brown drainage saturating ABD pads/gauze. Surrounding skin around the gastrostomy tube site and a large portion of abdominal skin red/exoriated.   Skin:    General: Skin is warm and dry.  Neurological:     Comments: Unable to assess      Imaging: DG Abd 1 View  Result Date: 11/02/2019 CLINICAL DATA:  NG tube placement EXAM: ABDOMEN - 1 VIEW COMPARISON:  October 31, 2019 FINDINGS: A PEG tube is identified, unchanged since October 31, 2019. An OG tube is been placed in the interval and terminates in the right mid abdomen, either in the distal stomach or proximal duodenum. Contrast is seen throughout the  length of the colon. No other acute abnormalities. IMPRESSION: 1. A PEG tube is again noted as above. 2. A new OG tube is identified. The distal tip is in the region of the distal stomach or proximal duodenum. Electronically Signed   By: Gerome Sam III M.D   On: 11/02/2019 15:00   DG CHEST PORT 1 VIEW  Result Date: 11/04/2019 CLINICAL DATA:  Pneumonia. EXAM: PORTABLE CHEST 1 VIEW COMPARISON:  11/03/2018 FINDINGS: Tracheostomy tube tip is stable above the carina. NG tube tip is below the GE junction. Normal heart size. No pleural effusion or edema. No change in aeration to lungs compared with previous exam. IMPRESSION: No change in aeration to the lungs compared with previous exam. Stable tracheostomy tube and NG tube. Electronically Signed   By: Signa Kell M.D.   On: 11/04/2019 07:39   DG CHEST PORT 1 VIEW  Result Date: 11/03/2019 CLINICAL DATA:  Evaluate tracheostomy tube tip. EXAM: PORTABLE CHEST 1 VIEW COMPARISON:  10/31/2019 FINDINGS: Satisfactory position of tracheostomy tube with tip above the carina. There is a nasogastric tube with tip below the level of the GE junction. Heart size appears within normal limits. No pleural effusion or edema identified. No airspace densities. IMPRESSION: Satisfactory position of tracheostomy tube with tip above the carina. Electronically Signed   By: Signa Kell M.D.   On: 11/03/2019 08:57    Labs:  CBC: Recent Labs    11/02/19 0548 11/03/19 0723 11/04/19 1048 11/05/19 0740  WBC 11.1* 14.7* 13.2* 14.0*  HGB 9.9* 10.7* 10.5* 8.7*  HCT 32.1* 35.1* 35.3* 27.9*  PLT 376 371 351 270    COAGS: Recent Labs    10/31/19 1115  INR 1.2  APTT 34    BMP: Recent Labs    11/02/19 0548 11/03/19 0723 11/04/19 1048 11/05/19 0740  NA 143 146* 146* 145  K 4.0 4.3 5.4* 4.2  CL 111 110 112* 112*  CO2 22 25 23  21*  GLUCOSE 91 92 102* 130*  BUN 12 17 32* 51*  CALCIUM 7.6* 7.7* 7.6* 7.6*  CREATININE 0.91 0.86 0.98 1.20  GFRNONAA >60 >60 >60  >60  GFRAA >60 >60 >60 >60    LIVER FUNCTION TESTS: Recent Labs    10/31/19 1115 11/01/19 0720 11/05/19 0740  BILITOT 0.5  --   --   AST 29  --   --   ALT 34  --   --   ALKPHOS 63  --   --   PROT 5.8*  --   --   ALBUMIN 1.9* 1.8* 1.6*    Assessment and Plan:  Gastrostomy tube displaced: Tube pulled out of stomach and into the abdominal wall area. The drainage has eroded the gastrostomy site and a large portion of the abdominal skin. The white bumper of the gastrostomy tube was clearly visible. Viscous lidocaine applied and gastrostomy tube was easily removed. Patient tolerated procedure well. When the skin on the abdomen has healed a new order for gastrostomy tube can be placed and IR will evaluate the patient at that time.  IR will sign off. Please call IR with any questions.   Electronically Signed: 11/07/19, AGACNP-BC [phone] 11/05/2019, 3:07 PM   I spent a total of 25 Minutes at the the patient's bedside AND on the patient's hospital floor or unit, greater than 50% of which was counseling/coordinating care for gastrostomy tube evaluation/removal.

## 2019-11-05 NOTE — Progress Notes (Signed)
[name]   PULMONARY CRITICAL CARE SERVICE  PROGRESS NOTE  Date of Service: 11/05/2019  LEMONTE Roach  MGN:[id number]  DOB: Sep 09, 1960   DOA: 10/30/2019  Referring Physician: Carron Curie, MD  HPI: Connor Roach is a 59 y.o. male seen for follow up of Acute on Chronic Respiratory Failure.  Remains on the ventilator and full support.  Not tolerating weaning at this time.  Patient has been on 40% FiO2  Medications: Reviewed on Rounds  Physical Exam:  Vitals: Temperature 98.9 pulse 79 respiratory rate 25 blood pressure is 135/71 saturations 99%  Ventilator Settings on assist control FiO2 40% tidal volume is 400 PEEP 5  . General: Comfortable at this time . Eyes: Grossly normal lids, irises & conjunctiva . ENT: grossly tongue is normal . Neck: no obvious mass . Cardiovascular: S1 S2 normal no gallop . Respiratory: No rhonchi very coarse breath sounds . Abdomen: soft . Skin: no rash seen on limited exam . Musculoskeletal: not rigid . Psychiatric:unable to assess . Neurologic: no seizure no involuntary movements         Lab Data:   Basic Metabolic Panel: Recent Labs  Lab 11/01/19 0720 11/02/19 0548 11/03/19 0723 11/04/19 1048 11/05/19 0740  NA 143 143 146* 146* 145  K 4.1 4.0 4.3 5.4* 4.2  CL 111 111 110 112* 112*  CO2 25 22 25 23  21*  GLUCOSE 109* 91 92 102* 130*  BUN 12 12 17  32* 51*  CREATININE 0.87 0.91 0.86 0.98 1.20  CALCIUM 7.6* 7.6* 7.7* 7.6* 7.6*  MG 2.0 2.0 2.2 2.5* 2.6*  PHOS 3.2 3.2 4.1 4.1 4.5    ABG: Recent Labs  Lab 10/31/19 0030 11/03/19 0302  PHART 7.429 7.399  PCO2ART 40.2 45.7  PO2ART 95.6 88.8  HCO3 26.0 27.7  O2SAT 97.1 96.9    Liver Function Tests: Recent Labs  Lab 10/31/19 1115 11/01/19 0720 11/05/19 0740  AST 29  --   --   ALT 34  --   --   ALKPHOS 63  --   --   BILITOT 0.5  --   --   PROT 5.8*  --   --   ALBUMIN 1.9* 1.8* 1.6*   No results for input(s):  LIPASE, AMYLASE in the last 168 hours. Recent Labs  Lab 11/01/19 0720  AMMONIA 59*    CBC: Recent Labs  Lab 11/01/19 0720 11/02/19 0548 11/03/19 0723 11/04/19 1048 11/05/19 0740  WBC 13.5* 11.1* 14.7* 13.2* 14.0*  HGB 10.0* 9.9* 10.7* 10.5* 8.7*  HCT 31.5* 32.1* 35.1* 35.3* 27.9*  MCV 90.8 91.2 92.6 98.9 94.6  PLT 358 376 371 351 270    Cardiac Enzymes: No results for input(s): CKTOTAL, CKMB, CKMBINDEX, TROPONINI in the last 168 hours.  BNP (last 3 results) No results for input(s): BNP in the last 8760 hours.  ProBNP (last 3 results) No results for input(s): PROBNP in the last 8760 hours.  Radiological Exams: DG CHEST PORT 1 VIEW  Result Date: 11/04/2019 CLINICAL DATA:  Pneumonia. EXAM: PORTABLE CHEST 1 VIEW COMPARISON:  11/03/2018 FINDINGS: Tracheostomy tube tip is stable above the carina. NG tube tip is below the GE junction. Normal heart size. No pleural effusion or edema. No change in aeration to lungs compared with previous exam. IMPRESSION: No change in aeration to the lungs compared with previous exam. Stable tracheostomy tube and NG tube. Electronically Signed   By: 11/06/2019 M.D.   On: 11/04/2019 07:39  Assessment/Plan Active Problems:   Acute on chronic respiratory failure with hypoxia (HCC)   Chronic atrial fibrillation (HCC)   Seizure disorder (HCC)   Metabolic encephalopathy   COPD, severe (HCC)   1. Acute on chronic respiratory failure with hypoxia we will continue with full support on assist control right now requiring 40% FiO2.  Patient's mechanics have been poor not able to do any weaning. 2. Chronic atrial fibrillation rate is controlled 3. Seizure disorder no active seizures 4. Metabolic encephalopathy grossly unchanged 5. Severe COPD at baseline   I have personally seen and evaluated the patient, evaluated laboratory and imaging results, formulated the assessment and plan and placed orders. The Patient requires high complexity decision  making with multiple systems involvement.  Rounds were done with the Respiratory Therapy Director and Staff therapists and discussed with nursing staff also.  Yevonne Pax, MD [facility] Pulmonary Critical Care Medicine Sleep Medicine

## 2019-11-06 ENCOUNTER — Other Ambulatory Visit (HOSPITAL_COMMUNITY): Payer: BC Managed Care – PPO

## 2019-11-06 DIAGNOSIS — J9621 Acute and chronic respiratory failure with hypoxia: Secondary | ICD-10-CM | POA: Diagnosis not present

## 2019-11-06 DIAGNOSIS — J449 Chronic obstructive pulmonary disease, unspecified: Secondary | ICD-10-CM | POA: Diagnosis not present

## 2019-11-06 DIAGNOSIS — G9341 Metabolic encephalopathy: Secondary | ICD-10-CM | POA: Diagnosis not present

## 2019-11-06 DIAGNOSIS — I482 Chronic atrial fibrillation, unspecified: Secondary | ICD-10-CM | POA: Diagnosis not present

## 2019-11-06 LAB — CBC
HCT: 25.4 % — ABNORMAL LOW (ref 39.0–52.0)
Hemoglobin: 7.8 g/dL — ABNORMAL LOW (ref 13.0–17.0)
MCH: 29.1 pg (ref 26.0–34.0)
MCHC: 30.7 g/dL (ref 30.0–36.0)
MCV: 94.8 fL (ref 80.0–100.0)
Platelets: 209 10*3/uL (ref 150–400)
RBC: 2.68 MIL/uL — ABNORMAL LOW (ref 4.22–5.81)
RDW: 13.9 % (ref 11.5–15.5)
WBC: 16.4 10*3/uL — ABNORMAL HIGH (ref 4.0–10.5)
nRBC: 0.1 % (ref 0.0–0.2)

## 2019-11-06 LAB — VANCOMYCIN, TROUGH: Vancomycin Tr: 25 ug/mL (ref 15–20)

## 2019-11-06 LAB — CULTURE, BLOOD (ROUTINE X 2)
Culture: NO GROWTH
Culture: NO GROWTH

## 2019-11-06 NOTE — Progress Notes (Signed)
Pulmonary Critical Care Medicine [facility] [place]   PULMONARY CRITICAL CARE SERVICE  PROGRESS NOTE  Date of Service: 11/06/2019  Connor Roach  LYY:[id number]  DOB: 10-10-1960   DOA: 10/30/2019  Referring Physician: Carron Curie, MD  HPI: Connor Roach is a 59 y.o. male seen for follow up of Acute on Chronic Respiratory Failure.  Patient currently is on assist control mode has been on 30% FiO2 with good saturations.  Secretions are fair to moderate.  Patient still not able to tolerate spontaneous breathing trials  Medications: Reviewed on Rounds  Physical Exam:  Vitals: Temperature is 98.3 pulse 75 respiratory rate 16 blood pressure is 107/56 saturations 98%  Ventilator Settings on assist control FiO2 30% tidal volume is 480 PEEP 5  . General: Comfortable at this time . Eyes: Grossly normal lids, irises & conjunctiva . ENT: grossly tongue is normal . Neck: no obvious mass . Cardiovascular: S1 S2 normal no gallop . Respiratory: No rhonchi no rales . Abdomen: soft . Skin: no rash seen on limited exam . Musculoskeletal: not rigid . Psychiatric:unable to assess . Neurologic: no seizure no involuntary movements         Lab Data:   Basic Metabolic Panel: Recent Labs  Lab 11/01/19 0720 11/02/19 0548 11/03/19 0723 11/04/19 1048 11/05/19 0740  NA 143 143 146* 146* 145  K 4.1 4.0 4.3 5.4* 4.2  CL 111 111 110 112* 112*  CO2 25 22 25 23  21*  GLUCOSE 109* 91 92 102* 130*  BUN 12 12 17  32* 51*  CREATININE 0.87 0.91 0.86 0.98 1.20  CALCIUM 7.6* 7.6* 7.7* 7.6* 7.6*  MG 2.0 2.0 2.2 2.5* 2.6*  PHOS 3.2 3.2 4.1 4.1 4.5    ABG: Recent Labs  Lab 10/31/19 0030 11/03/19 0302  PHART 7.429 7.399  PCO2ART 40.2 45.7  PO2ART 95.6 88.8  HCO3 26.0 27.7  O2SAT 97.1 96.9    Liver Function Tests: Recent Labs  Lab 10/31/19 1115 11/01/19 0720 11/05/19 0740  AST 29  --   --   ALT 34  --   --   ALKPHOS 63  --   --   BILITOT 0.5  --   --   PROT 5.8*  --   --    ALBUMIN 1.9* 1.8* 1.6*   No results for input(s): LIPASE, AMYLASE in the last 168 hours. Recent Labs  Lab 11/01/19 0720  AMMONIA 59*    CBC: Recent Labs  Lab 11/02/19 0548 11/03/19 0723 11/04/19 1048 11/05/19 0740 11/06/19 0927  WBC 11.1* 14.7* 13.2* 14.0* 16.4*  HGB 9.9* 10.7* 10.5* 8.7* 7.8*  HCT 32.1* 35.1* 35.3* 27.9* 25.4*  MCV 91.2 92.6 98.9 94.6 94.8  PLT 376 371 351 270 209    Cardiac Enzymes: No results for input(s): CKTOTAL, CKMB, CKMBINDEX, TROPONINI in the last 168 hours.  BNP (last 3 results) No results for input(s): BNP in the last 8760 hours.  ProBNP (last 3 results) No results for input(s): PROBNP in the last 8760 hours.  Radiological Exams: DG Abd 1 View  Result Date: 11/06/2019 CLINICAL DATA:  Abdominal distension. EXAM: ABDOMEN - 1 VIEW COMPARISON:  11/02/2019 FINDINGS: Nasogastric tube is present with tip just right of midline likely over the distal stomach. Air is present throughout the colon with several prominent colonic loops as the transverse colon measures 8.4 cm in diameter. Contrast is present over the rectosigmoid colon from recent CT scan. Small caliber rectal catheter is present. No free peritoneal air. No dilated  small bowel loops. Remainder of the exam is unchanged. IMPRESSION: 1. Air throughout the colon with a few prominent colonic loops likely colonic ileus. 2. Nasogastric tube with tip just right of midline likely over the distal stomach. Electronically Signed   By: Elberta Fortis M.D.   On: 11/06/2019 14:23    Assessment/Plan Active Problems:   Acute on chronic respiratory failure with hypoxia (HCC)   Chronic atrial fibrillation (HCC)   Seizure disorder (HCC)   Metabolic encephalopathy   COPD, severe (HCC)   1. Acute on chronic respiratory failure hypoxia we will continue with full support on the ventilator not tolerating weaning. 2. Chronic atrial fibrillation rate is controlled we will monitor 3. Seizure disorder no active  seizure 4. Metabolic encephalopathy at baseline 5. Severe COPD at baseline we will monitor   I have personally seen and evaluated the patient, evaluated laboratory and imaging results, formulated the assessment and plan and placed orders. The Patient requires high complexity decision making with multiple systems involvement.  Rounds were done with the Respiratory Therapy Director and Staff therapists and discussed with nursing staff also.  Yevonne Pax, MD [facility] Pulmonary Critical Care Medicine Sleep Medicine

## 2019-11-07 ENCOUNTER — Encounter (HOSPITAL_BASED_OUTPATIENT_CLINIC_OR_DEPARTMENT_OTHER): Payer: BC Managed Care – PPO

## 2019-11-07 DIAGNOSIS — I482 Chronic atrial fibrillation, unspecified: Secondary | ICD-10-CM | POA: Diagnosis not present

## 2019-11-07 DIAGNOSIS — G9341 Metabolic encephalopathy: Secondary | ICD-10-CM | POA: Diagnosis not present

## 2019-11-07 DIAGNOSIS — R609 Edema, unspecified: Secondary | ICD-10-CM

## 2019-11-07 DIAGNOSIS — J9621 Acute and chronic respiratory failure with hypoxia: Secondary | ICD-10-CM | POA: Diagnosis not present

## 2019-11-07 DIAGNOSIS — J449 Chronic obstructive pulmonary disease, unspecified: Secondary | ICD-10-CM | POA: Diagnosis not present

## 2019-11-07 LAB — CBC
HCT: 25.5 % — ABNORMAL LOW (ref 39.0–52.0)
Hemoglobin: 8.2 g/dL — ABNORMAL LOW (ref 13.0–17.0)
MCH: 29.3 pg (ref 26.0–34.0)
MCHC: 32.2 g/dL (ref 30.0–36.0)
MCV: 91.1 fL (ref 80.0–100.0)
Platelets: 195 10*3/uL (ref 150–400)
RBC: 2.8 MIL/uL — ABNORMAL LOW (ref 4.22–5.81)
RDW: 14.5 % (ref 11.5–15.5)
WBC: 11.5 10*3/uL — ABNORMAL HIGH (ref 4.0–10.5)
nRBC: 0.2 % (ref 0.0–0.2)

## 2019-11-07 LAB — BASIC METABOLIC PANEL
Anion gap: 10 (ref 5–15)
BUN: 29 mg/dL — ABNORMAL HIGH (ref 6–20)
CO2: 23 mmol/L (ref 22–32)
Calcium: 7.3 mg/dL — ABNORMAL LOW (ref 8.9–10.3)
Chloride: 115 mmol/L — ABNORMAL HIGH (ref 98–111)
Creatinine, Ser: 1.26 mg/dL — ABNORMAL HIGH (ref 0.61–1.24)
GFR calc Af Amer: >60 mL/min (ref >60)
GFR calc non Af Amer: >60 mL/min (ref >60)
Glucose, Bld: 118 mg/dL — ABNORMAL HIGH (ref 70–99)
Potassium: 2.7 mmol/L — CL (ref 3.5–5.1)
Sodium: 148 mmol/L — ABNORMAL HIGH (ref 135–145)

## 2019-11-07 LAB — VANCOMYCIN, TROUGH: Vancomycin Tr: 9 ug/mL — ABNORMAL LOW (ref 15–20)

## 2019-11-07 LAB — AMMONIA: Ammonia: 53 umol/L — ABNORMAL HIGH (ref 9–35)

## 2019-11-07 LAB — PHOSPHORUS: Phosphorus: 2.3 mg/dL — ABNORMAL LOW (ref 2.5–4.6)

## 2019-11-07 LAB — MAGNESIUM: Magnesium: 2.4 mg/dL (ref 1.7–2.4)

## 2019-11-07 NOTE — Progress Notes (Signed)
Pulmonary Critical Care Medicine [facility] [place]   PULMONARY CRITICAL CARE SERVICE  PROGRESS NOTE  Date of Service: 11/07/2019  Connor Roach  ERX:[id number]  DOB: 1961-02-10   DOA: 10/30/2019  Referring Physician: Carron Curie, MD  HPI: Connor Roach is a 59 y.o. male seen for follow up of Acute on Chronic Respiratory Failure.  Patient currently is on assist control full support not tolerating weaning which was attempted.  Medications: Reviewed on Rounds  Physical Exam:  Vitals: Temperature 99.1 pulse 72 respiratory 21 blood pressure is 120/60 saturations 98%  Ventilator Settings on assist control FiO2 is 28% tidal volume 480 with a PEEP of 5   General: Comfortable at this time  Eyes: Grossly normal lids, irises & conjunctiva  ENT: grossly tongue is normal  Neck: no obvious mass  Cardiovascular: S1 S2 normal no gallop  Respiratory: No rhonchi no rales are noted at this time  Abdomen: soft  Skin: no rash seen on limited exam  Musculoskeletal: not rigid  Psychiatric:unable to assess  Neurologic: no seizure no involuntary movements         Lab Data:   Basic Metabolic Panel: Recent Labs  Lab 11/01/19 0720 11/02/19 0548 11/03/19 0723 11/04/19 1048 11/05/19 0740  NA 143 143 146* 146* 145  K 4.1 4.0 4.3 5.4* 4.2  CL 111 111 110 112* 112*  CO2 25 22 25 23  21*  GLUCOSE 109* 91 92 102* 130*  BUN 12 12 17  32* 51*  CREATININE 0.87 0.91 0.86 0.98 1.20  CALCIUM 7.6* 7.6* 7.7* 7.6* 7.6*  MG 2.0 2.0 2.2 2.5* 2.6*  PHOS 3.2 3.2 4.1 4.1 4.5    ABG: Recent Labs  Lab 11/03/19 0302  PHART 7.399  PCO2ART 45.7  PO2ART 88.8  HCO3 27.7  O2SAT 96.9    Liver Function Tests: Recent Labs  Lab 11/01/19 0720 11/05/19 0740  ALBUMIN 1.8* 1.6*   No results for input(s): LIPASE, AMYLASE in the last 168 hours. Recent Labs  Lab 11/01/19 0720  AMMONIA 59*    CBC: Recent Labs  Lab 11/02/19 0548 11/03/19 0723 11/04/19 1048 11/05/19 0740  11/06/19 0927  WBC 11.1* 14.7* 13.2* 14.0* 16.4*  HGB 9.9* 10.7* 10.5* 8.7* 7.8*  HCT 32.1* 35.1* 35.3* 27.9* 25.4*  MCV 91.2 92.6 98.9 94.6 94.8  PLT 376 371 351 270 209    Cardiac Enzymes: No results for input(s): CKTOTAL, CKMB, CKMBINDEX, TROPONINI in the last 168 hours.  BNP (last 3 results) No results for input(s): BNP in the last 8760 hours.  ProBNP (last 3 results) No results for input(s): PROBNP in the last 8760 hours.  Radiological Exams: DG Abd 1 View  Result Date: 11/06/2019 CLINICAL DATA:  Abdominal distension. EXAM: ABDOMEN - 1 VIEW COMPARISON:  11/02/2019 FINDINGS: Nasogastric tube is present with tip just right of midline likely over the distal stomach. Air is present throughout the colon with several prominent colonic loops as the transverse colon measures 8.4 cm in diameter. Contrast is present over the rectosigmoid colon from recent CT scan. Small caliber rectal catheter is present. No free peritoneal air. No dilated small bowel loops. Remainder of the exam is unchanged. IMPRESSION: 1. Air throughout the colon with a few prominent colonic loops likely colonic ileus. 2. Nasogastric tube with tip just right of midline likely over the distal stomach. Electronically Signed   By: 11/08/2019 M.D.   On: 11/06/2019 14:23   VAS Elberta Fortis UPPER EXTREMITY VENOUS DUPLEX  Result Date: 11/07/2019 UPPER VENOUS  STUDY  Indications: Edema Limitations: Trach collar. Comparison Study: No prior study Performing Technologist: Gertie Fey MHA, RDMS, RVT, RDCS  Examination Guidelines: A complete evaluation includes B-mode imaging, spectral Doppler, color Doppler, and power Doppler as needed of all accessible portions of each vessel. Bilateral testing is considered an integral part of a complete examination. Limited examinations for reoccurring indications may be performed as noted.  Right Findings: +----------+------------+---------+-----------+----------+--------------+  RIGHT       Compressible Phasicity Spontaneous Properties    Summary      +----------+------------+---------+-----------+----------+--------------+  IJV                                                      Not visualized  +----------+------------+---------+-----------+----------+--------------+  Subclavian     Full        Yes        Yes                                +----------+------------+---------+-----------+----------+--------------+  Axillary       Full        Yes        Yes                                +----------+------------+---------+-----------+----------+--------------+  Brachial       Full        Yes        Yes                                +----------+------------+---------+-----------+----------+--------------+  Radial         Full                                                      +----------+------------+---------+-----------+----------+--------------+  Cephalic       None                                          Acute       +----------+------------+---------+-----------+----------+--------------+  Basilic        Full                                                      +----------+------------+---------+-----------+----------+--------------+  Left Findings: +----------+------------+---------+-----------+----------+--------------+  LEFT       Compressible Phasicity Spontaneous Properties    Summary      +----------+------------+---------+-----------+----------+--------------+  Subclavian                                               Not visualized  +----------+------------+---------+-----------+----------+--------------+  Summary:  Right: No evidence of deep vein thrombosis in the upper extremity. Findings  consistent with acute superficial vein thrombosis involving the right cephalic vein.  *See table(s) above for measurements and observations.    Preliminary     Assessment/Plan Active Problems:   Acute on chronic respiratory failure with hypoxia (HCC)   Chronic atrial fibrillation (HCC)   Seizure  disorder (HCC)   Metabolic encephalopathy   COPD, severe (HCC)   1. Acute on chronic respiratory failure hypoxia we will continue with assist control patient not tolerating the wean continue secretion management supportive care. 2. Chronic atrial fibrillation rate is controlled we will continue to follow along. 3. Seizure disorder no active seizure noted we will monitor 4. Metabolic encephalopathy at baseline 5. Severe COPD continue to follow along   I have personally seen and evaluated the patient, evaluated laboratory and imaging results, formulated the assessment and plan and placed orders. The Patient requires high complexity decision making with multiple systems involvement.  Rounds were done with the Respiratory Therapy Director and Staff therapists and discussed with nursing staff also.  Yevonne Pax, MD [facility] Pulmonary Critical Care Medicine Sleep Medicine

## 2019-11-07 NOTE — Progress Notes (Signed)
Right upper extremity venous duplex completed. Refer to "CV Proc" under chart review to view preliminary results.  11/07/2019 2:31 PM Eula Fried., MHA, RVT, RDCS, RDMS

## 2019-11-08 ENCOUNTER — Other Ambulatory Visit (HOSPITAL_COMMUNITY): Payer: BC Managed Care – PPO

## 2019-11-08 DIAGNOSIS — J9621 Acute and chronic respiratory failure with hypoxia: Secondary | ICD-10-CM | POA: Diagnosis not present

## 2019-11-08 DIAGNOSIS — I482 Chronic atrial fibrillation, unspecified: Secondary | ICD-10-CM | POA: Diagnosis not present

## 2019-11-08 DIAGNOSIS — J449 Chronic obstructive pulmonary disease, unspecified: Secondary | ICD-10-CM | POA: Diagnosis not present

## 2019-11-08 DIAGNOSIS — G9341 Metabolic encephalopathy: Secondary | ICD-10-CM | POA: Diagnosis not present

## 2019-11-08 LAB — BASIC METABOLIC PANEL
Anion gap: 8 (ref 5–15)
BUN: 32 mg/dL — ABNORMAL HIGH (ref 6–20)
CO2: 22 mmol/L (ref 22–32)
Calcium: 7.1 mg/dL — ABNORMAL LOW (ref 8.9–10.3)
Chloride: 115 mmol/L — ABNORMAL HIGH (ref 98–111)
Creatinine, Ser: 1.36 mg/dL — ABNORMAL HIGH (ref 0.61–1.24)
GFR calc Af Amer: >60 mL/min (ref >60)
GFR calc non Af Amer: 56 mL/min — ABNORMAL LOW (ref >60)
Glucose, Bld: 132 mg/dL — ABNORMAL HIGH (ref 70–99)
Potassium: 2.5 mmol/L — CL (ref 3.5–5.1)
Sodium: 145 mmol/L (ref 135–145)

## 2019-11-08 NOTE — Progress Notes (Signed)
Pulmonary Critical Care Medicine [facility] [place]   PULMONARY CRITICAL CARE SERVICE  PROGRESS NOTE  Date of Service: 11/08/2019  Connor Roach  SHF:[id number]  DOB: 04/24/1960   DOA: 10/30/2019  Referring Physician: Carron Curie, MD  HPI: Connor Roach is a 59 y.o. male seen for follow up of Acute on Chronic Respiratory Failure.  Patient currently is on full support on assist control mode has been on 28% FiO2 and is on PEEP 5  Medications: Reviewed on Rounds  Physical Exam:  Vitals: Temperature is 98.6 pulse 78 respiratory rate 16 blood pressure is 129/69 saturation is 100%  Ventilator Settings on assist control FiO2 28% tidal volume 830 PEEP 5  . General: Comfortable at this time . Eyes: Grossly normal lids, irises & conjunctiva . ENT: grossly tongue is normal . Neck: no obvious mass . Cardiovascular: S1 S2 normal no gallop . Respiratory: No rhonchi no rales are noted at this time . Abdomen: soft . Skin: no rash seen on limited exam . Musculoskeletal: not rigid . Psychiatric:unable to assess . Neurologic: no seizure no involuntary movements         Lab Data:   Basic Metabolic Panel: Recent Labs  Lab 11/02/19 0548 11/03/19 0723 11/04/19 1048 11/05/19 0740 11/07/19 1932  NA 143 146* 146* 145 148*  K 4.0 4.3 5.4* 4.2 2.7*  CL 111 110 112* 112* 115*  CO2 22 25 23  21* 23  GLUCOSE 91 92 102* 130* 118*  BUN 12 17 32* 51* 29*  CREATININE 0.91 0.86 0.98 1.20 1.26*  CALCIUM 7.6* 7.7* 7.6* 7.6* 7.3*  MG 2.0 2.2 2.5* 2.6* 2.4  PHOS 3.2 4.1 4.1 4.5 2.3*    ABG: Recent Labs  Lab 11/03/19 0302  PHART 7.399  PCO2ART 45.7  PO2ART 88.8  HCO3 27.7  O2SAT 96.9    Liver Function Tests: Recent Labs  Lab 11/05/19 0740  ALBUMIN 1.6*   No results for input(s): LIPASE, AMYLASE in the last 168 hours. Recent Labs  Lab 11/07/19 1932  AMMONIA 53*    CBC: Recent Labs  Lab 11/03/19 0723 11/04/19 1048 11/05/19 0740 11/06/19 0927 11/07/19 1932   WBC 14.7* 13.2* 14.0* 16.4* 11.5*  HGB 10.7* 10.5* 8.7* 7.8* 8.2*  HCT 35.1* 35.3* 27.9* 25.4* 25.5*  MCV 92.6 98.9 94.6 94.8 91.1  PLT 371 351 270 209 195    Cardiac Enzymes: No results for input(s): CKTOTAL, CKMB, CKMBINDEX, TROPONINI in the last 168 hours.  BNP (last 3 results) No results for input(s): BNP in the last 8760 hours.  ProBNP (last 3 results) No results for input(s): PROBNP in the last 8760 hours.  Radiological Exams: DG Abd 1 View  Result Date: 11/06/2019 CLINICAL DATA:  Abdominal distension. EXAM: ABDOMEN - 1 VIEW COMPARISON:  11/02/2019 FINDINGS: Nasogastric tube is present with tip just right of midline likely over the distal stomach. Air is present throughout the colon with several prominent colonic loops as the transverse colon measures 8.4 cm in diameter. Contrast is present over the rectosigmoid colon from recent CT scan. Small caliber rectal catheter is present. No free peritoneal air. No dilated small bowel loops. Remainder of the exam is unchanged. IMPRESSION: 1. Air throughout the colon with a few prominent colonic loops likely colonic ileus. 2. Nasogastric tube with tip just right of midline likely over the distal stomach. Electronically Signed   By: 11/04/2019 M.D.   On: 11/06/2019 14:23   DG Abd Portable 1V  Result Date: 11/08/2019 CLINICAL DATA:  Ileus EXAM: PORTABLE ABDOMEN - 1 VIEW COMPARISON:  11/06/2019 FINDINGS: Nasogastric tube is unchanged with its tip overlying the expected distal stomach. There is moderate gaseous distension of the colon with stable marked distension of the cecum which measures at least 13 cm in diameter, slightly progressive since prior examination. The findings are suggestive of a colonic ileus. No gross free intraperitoneal gas. No organomegaly. IMPRESSION: Persistent colonic ileus. Progressive distension of the cecum, now measuring 13 cm in diameter. Electronically Signed   By: Helyn Numbers MD   On: 11/08/2019 06:06   VAS [place]  UPPER EXTREMITY VENOUS DUPLEX  Result Date: 11/07/2019 UPPER VENOUS STUDY  Indications: Edema Limitations: Trach collar. Comparison Study: No prior study Performing Technologist: Gertie Fey MHA, RDMS, RVT, RDCS  Examination Guidelines: A complete evaluation includes B-mode imaging, spectral Doppler, color Doppler, and power Doppler as needed of all accessible portions of each vessel. Bilateral testing is considered an integral part of a complete examination. Limited examinations for reoccurring indications may be performed as noted.  Right Findings: +----------+------------+---------+-----------+----------+--------------+ RIGHT     CompressiblePhasicitySpontaneousProperties   Summary     +----------+------------+---------+-----------+----------+--------------+ IJV                                                 Not visualized +----------+------------+---------+-----------+----------+--------------+ Subclavian    Full       Yes       Yes                             +----------+------------+---------+-----------+----------+--------------+ Axillary      Full       Yes       Yes                             +----------+------------+---------+-----------+----------+--------------+ Brachial      Full       Yes       Yes                             +----------+------------+---------+-----------+----------+--------------+ Radial        Full                                                 +----------+------------+---------+-----------+----------+--------------+ Cephalic      None                                      Acute      +----------+------------+---------+-----------+----------+--------------+ Basilic       Full                                                 +----------+------------+---------+-----------+----------+--------------+  Left Findings: +----------+------------+---------+-----------+----------+--------------+ LEFT       CompressiblePhasicitySpontaneousProperties   Summary     +----------+------------+---------+-----------+----------+--------------+ Subclavian  Not visualized +----------+------------+---------+-----------+----------+--------------+  Summary:  Right: No evidence of deep vein thrombosis in the upper extremity. Findings consistent with acute superficial vein thrombosis involving the right cephalic vein.  *See table(s) above for measurements and observations.  Diagnosing physician: Coral Else MD Electronically signed by Coral Else MD on 11/07/2019 at 9:32:03 PM.    Final     Assessment/Plan Active Problems:   Acute on chronic respiratory failure with hypoxia (HCC)   Chronic atrial fibrillation (HCC)   Seizure disorder (HCC)   Metabolic encephalopathy   COPD, severe (HCC)   1. Acute on chronic respiratory failure hypoxia plan will be to try to wean today will place on pressure support as tolerated. 2. Chronic atrial fibrillation rate is controlled 3. Seizure disorder no active seizures 4. Metabolic encephalopathy remains unchanged 5. Severe COPD medical management   I have personally seen and evaluated the patient, evaluated laboratory and imaging results, formulated the assessment and plan and placed orders. The Patient requires high complexity decision making with multiple systems involvement.  Rounds were done with the Respiratory Therapy Director and Staff therapists and discussed with nursing staff also.  Yevonne Pax, MD [facility] Pulmonary Critical Care Medicine Sleep Medicine

## 2019-11-09 ENCOUNTER — Other Ambulatory Visit (HOSPITAL_COMMUNITY): Payer: BC Managed Care – PPO

## 2019-11-09 DIAGNOSIS — I482 Chronic atrial fibrillation, unspecified: Secondary | ICD-10-CM | POA: Diagnosis not present

## 2019-11-09 DIAGNOSIS — J449 Chronic obstructive pulmonary disease, unspecified: Secondary | ICD-10-CM | POA: Diagnosis not present

## 2019-11-09 DIAGNOSIS — J9621 Acute and chronic respiratory failure with hypoxia: Secondary | ICD-10-CM | POA: Diagnosis not present

## 2019-11-09 DIAGNOSIS — G9341 Metabolic encephalopathy: Secondary | ICD-10-CM | POA: Diagnosis not present

## 2019-11-09 LAB — TYPE AND SCREEN
ABO/RH(D): B POS
Antibody Screen: NEGATIVE
Unit division: 0
Unit division: 0

## 2019-11-09 LAB — BPAM RBC
Blood Product Expiration Date: 202110152359
Blood Product Expiration Date: 202110162359
ISSUE DATE / TIME: 202109211756
Unit Type and Rh: 7300
Unit Type and Rh: 7300

## 2019-11-09 LAB — BASIC METABOLIC PANEL
Anion gap: 5 (ref 5–15)
BUN: 31 mg/dL — ABNORMAL HIGH (ref 6–20)
CO2: 21 mmol/L — ABNORMAL LOW (ref 22–32)
Calcium: 7.2 mg/dL — ABNORMAL LOW (ref 8.9–10.3)
Chloride: 116 mmol/L — ABNORMAL HIGH (ref 98–111)
Creatinine, Ser: 1.43 mg/dL — ABNORMAL HIGH (ref 0.61–1.24)
GFR calc Af Amer: >60 mL/min (ref >60)
GFR calc non Af Amer: 53 mL/min — ABNORMAL LOW (ref >60)
Glucose, Bld: 111 mg/dL — ABNORMAL HIGH (ref 70–99)
Potassium: 3.2 mmol/L — ABNORMAL LOW (ref 3.5–5.1)
Sodium: 142 mmol/L (ref 135–145)

## 2019-11-09 LAB — CBC
HCT: 23.4 % — ABNORMAL LOW (ref 39.0–52.0)
Hemoglobin: 7.2 g/dL — ABNORMAL LOW (ref 13.0–17.0)
MCH: 28.1 pg (ref 26.0–34.0)
MCHC: 30.8 g/dL (ref 30.0–36.0)
MCV: 91.4 fL (ref 80.0–100.0)
Platelets: 122 10*3/uL — ABNORMAL LOW (ref 150–400)
RBC: 2.56 MIL/uL — ABNORMAL LOW (ref 4.22–5.81)
RDW: 14.9 % (ref 11.5–15.5)
WBC: 7.7 10*3/uL (ref 4.0–10.5)
nRBC: 0 % (ref 0.0–0.2)

## 2019-11-09 LAB — OCCULT BLOOD X 1 CARD TO LAB, STOOL: Fecal Occult Bld: POSITIVE — AB

## 2019-11-09 LAB — MAGNESIUM: Magnesium: 2.3 mg/dL (ref 1.7–2.4)

## 2019-11-09 NOTE — Progress Notes (Addendum)
Pulmonary Critical Care Medicine [facility] [place]   PULMONARY CRITICAL CARE SERVICE  PROGRESS NOTE  Date of Service: 11/09/2019  Connor Roach  UXN:[id number]  DOB: 1961/02/13   DOA: 10/30/2019  Referring Physician: Carron Curie, MD  HPI: Connor Roach is a 59 y.o. male seen for follow up of Acute on Chronic Respiratory Failure.  Patient continues to wean on pressure support today for goal of 8 hours currently requiring 28% FiO2 satting well no distress.  Medications: Reviewed on Rounds  Physical Exam:  Vitals: Pulse 81 respirations 22 BP 97/50 O2 sat 100% temp 100.0  Ventilator Settings pressure support 12/5 FiO2 28%  . General: Comfortable at this time . Eyes: Grossly normal lids, irises & conjunctiva . ENT: grossly tongue is normal . Neck: no obvious mass . Cardiovascular: S1 S2 normal no gallop . Respiratory: Coarse breath sounds . Abdomen: soft . Skin: no rash seen on limited exam . Musculoskeletal: not rigid . Psychiatric:unable to assess . Neurologic: no seizure no involuntary movements         Lab Data:   Basic Metabolic Panel: Recent Labs  Lab 11/03/19 0723 11/03/19 0723 11/04/19 1048 11/05/19 0740 11/07/19 1932 11/08/19 1552 11/09/19 1323  NA 146*   < > 146* 145 148* 145 142  K 4.3   < > 5.4* 4.2 2.7* 2.5* 3.2*  CL 110   < > 112* 112* 115* 115* 116*  CO2 25   < > 23 21* 23 22 21*  GLUCOSE 92   < > 102* 130* 118* 132* 111*  BUN 17   < > 32* 51* 29* 32* 31*  CREATININE 0.86   < > 0.98 1.20 1.26* 1.36* 1.43*  CALCIUM 7.7*   < > 7.6* 7.6* 7.3* 7.1* 7.2*  MG 2.2  --  2.5* 2.6* 2.4  --  2.3  PHOS 4.1  --  4.1 4.5 2.3*  --   --    < > = values in this interval not displayed.    ABG: Recent Labs  Lab 11/03/19 0302  PHART 7.399  PCO2ART 45.7  PO2ART 88.8  HCO3 27.7  O2SAT 96.9    Liver Function Tests: Recent Labs  Lab 11/05/19 0740  ALBUMIN 1.6*   No results for input(s): LIPASE, AMYLASE in the last 168 hours. Recent Labs   Lab 11/07/19 1932  AMMONIA 53*    CBC: Recent Labs  Lab 11/04/19 1048 11/05/19 0740 11/06/19 0927 11/07/19 1932 11/09/19 1323  WBC 13.2* 14.0* 16.4* 11.5* 7.7  HGB 10.5* 8.7* 7.8* 8.2* 7.2*  HCT 35.3* 27.9* 25.4* 25.5* 23.4*  MCV 98.9 94.6 94.8 91.1 91.4  PLT 351 270 209 195 122*    Cardiac Enzymes: No results for input(s): CKTOTAL, CKMB, CKMBINDEX, TROPONINI in the last 168 hours.  BNP (last 3 results) No results for input(s): BNP in the last 8760 hours.  ProBNP (last 3 results) No results for input(s): PROBNP in the last 8760 hours.  Radiological Exams: DG CHEST PORT 1 VIEW  Result Date: 11/09/2019 CLINICAL DATA:  Fever EXAM: PORTABLE CHEST 1 VIEW COMPARISON:  11/04/2019 FINDINGS: New patchy opacities in the lung bases. No visible pleural effusions. No pneumothorax. Tracheostomy tube in similar position, projecting at midline. Gastric tube courses below the diaphragm in outside the field of view. Cardiac silhouette is within normal limits. IMPRESSION: New patchy opacities in lung bases, possibly representing early pneumonia given the clinical history. Electronically Signed   By: Feliberto Harts MD   On: 11/09/2019  11:41   DG Abd Portable 1V  Result Date: 11/08/2019 CLINICAL DATA:  Ileus EXAM: PORTABLE ABDOMEN - 1 VIEW COMPARISON:  11/06/2019 FINDINGS: Nasogastric tube is unchanged with its tip overlying the expected distal stomach. There is moderate gaseous distension of the colon with stable marked distension of the cecum which measures at least 13 cm in diameter, slightly progressive since prior examination. The findings are suggestive of a colonic ileus. No gross free intraperitoneal gas. No organomegaly. IMPRESSION: Persistent colonic ileus. Progressive distension of the cecum, now measuring 13 cm in diameter. Electronically Signed   By: Helyn Numbers MD   On: 11/08/2019 06:06    Assessment/Plan Active Problems:   Acute on chronic respiratory failure with hypoxia  (HCC)   Chronic atrial fibrillation (HCC)   Seizure disorder (HCC)   Metabolic encephalopathy   COPD, severe (HCC)   1. Acute on chronic respiratory failure hypoxia patient continues on pressure support 12/5 FiO2 20% for an 8-hour goal at this time satting well this time.  We will continue aggressive pulmonary toilet supportive measures. 2. Chronic atrial fibrillation rate is controlled 3. Seizure disorder no active seizures 4. Metabolic encephalopathy remains unchanged 5. Severe COPD medical management   I have personally seen and evaluated the patient, evaluated laboratory and imaging results, formulated the assessment and plan and placed orders. The Patient requires high complexity decision making with multiple systems involvement.  Rounds were done with the Respiratory Therapy Director and Staff therapists and discussed with nursing staff also.  Yevonne Pax, MD [facility] Pulmonary Critical Care Medicine Sleep Medicine

## 2019-11-10 ENCOUNTER — Other Ambulatory Visit (HOSPITAL_COMMUNITY): Payer: BC Managed Care – PPO

## 2019-11-10 DIAGNOSIS — G9341 Metabolic encephalopathy: Secondary | ICD-10-CM | POA: Diagnosis not present

## 2019-11-10 DIAGNOSIS — J449 Chronic obstructive pulmonary disease, unspecified: Secondary | ICD-10-CM | POA: Diagnosis not present

## 2019-11-10 DIAGNOSIS — I482 Chronic atrial fibrillation, unspecified: Secondary | ICD-10-CM | POA: Diagnosis not present

## 2019-11-10 DIAGNOSIS — J9621 Acute and chronic respiratory failure with hypoxia: Secondary | ICD-10-CM | POA: Diagnosis not present

## 2019-11-10 LAB — PHOSPHORUS: Phosphorus: 1.9 mg/dL — ABNORMAL LOW (ref 2.5–4.6)

## 2019-11-10 LAB — CBC
HCT: 24.9 % — ABNORMAL LOW (ref 39.0–52.0)
Hemoglobin: 7.7 g/dL — ABNORMAL LOW (ref 13.0–17.0)
MCH: 28.3 pg (ref 26.0–34.0)
MCHC: 30.9 g/dL (ref 30.0–36.0)
MCV: 91.5 fL (ref 80.0–100.0)
Platelets: 126 10*3/uL — ABNORMAL LOW (ref 150–400)
RBC: 2.72 MIL/uL — ABNORMAL LOW (ref 4.22–5.81)
RDW: 15.2 % (ref 11.5–15.5)
WBC: 7.6 10*3/uL (ref 4.0–10.5)
nRBC: 0 % (ref 0.0–0.2)

## 2019-11-10 LAB — BASIC METABOLIC PANEL
Anion gap: 7 (ref 5–15)
BUN: 29 mg/dL — ABNORMAL HIGH (ref 6–20)
CO2: 20 mmol/L — ABNORMAL LOW (ref 22–32)
Calcium: 7.3 mg/dL — ABNORMAL LOW (ref 8.9–10.3)
Chloride: 116 mmol/L — ABNORMAL HIGH (ref 98–111)
Creatinine, Ser: 1.28 mg/dL — ABNORMAL HIGH (ref 0.61–1.24)
GFR calc Af Amer: >60 mL/min (ref >60)
GFR calc non Af Amer: >60 mL/min (ref >60)
Glucose, Bld: 125 mg/dL — ABNORMAL HIGH (ref 70–99)
Potassium: 3.2 mmol/L — ABNORMAL LOW (ref 3.5–5.1)
Sodium: 143 mmol/L (ref 135–145)

## 2019-11-10 LAB — MAGNESIUM: Magnesium: 1.9 mg/dL (ref 1.7–2.4)

## 2019-11-10 NOTE — Progress Notes (Signed)
Pulmonary Critical Care Medicine [facility] [place]   PULMONARY CRITICAL CARE SERVICE  PROGRESS NOTE  Date of Service: 11/10/2019  Connor Roach  VEH:[id number]  DOB: [birth year]   DOA: 10/30/2019  Referring Physician: Carron Curie, MD  HPI: Connor Roach is a 58 y.o. male seen for follow up of Acute on Chronic Respiratory Failure.  Patient currently is on pressure support with a goal of about 12 hours today  Medications: Reviewed on Rounds  Physical Exam:  Vitals: Temperature 100.1 pulse 80 respiratory 22 blood pressure is 127/71 saturations 99%  Ventilator Settings on pressure support FiO2 28% pressure 12/5  . General: Comfortable at this time . Eyes: Grossly normal lids, irises & conjunctiva . ENT: grossly tongue is normal . Neck: no obvious mass . Cardiovascular: S1 S2 normal no gallop . Respiratory: No rhonchi or coarse breath sounds . Abdomen: soft . Skin: no rash seen on limited exam . Musculoskeletal: not rigid . Psychiatric:unable to assess . Neurologic: no seizure no involuntary movements         Lab Data:   Basic Metabolic Panel: Recent Labs  Lab 11/04/19 1048 11/04/19 1048 11/05/19 0740 11/07/19 1932 11/08/19 1552 11/09/19 1323 11/10/19 1001  NA 146*   < > 145 148* 145 142 143  K 5.4*   < > 4.2 2.7* 2.5* 3.2* 3.2*  CL 112*   < > 112* 115* 115* 116* 116*  CO2 23   < > 21* 23 22 21* 20*  GLUCOSE 102*   < > 130* 118* 132* 111* 125*  BUN 32*   < > 51* 29* 32* 31* 29*  CREATININE 0.98   < > 1.20 1.26* 1.36* 1.43* 1.28*  CALCIUM 7.6*   < > 7.6* 7.3* 7.1* 7.2* 7.3*  MG 2.5*  --  2.6* 2.4  --  2.3 1.9  PHOS 4.1  --  4.5 2.3*  --   --  1.9*   < > = values in this interval not displayed.    ABG: No results for input(s): PHART, PCO2ART, PO2ART, HCO3, O2SAT in the last 168 hours.  Liver Function Tests: Recent Labs  Lab 11/05/19 0740  ALBUMIN 1.6*   No results for input(s): LIPASE, AMYLASE in the last 168 hours. Recent Labs  Lab  11/07/19 1932  AMMONIA 53*    CBC: Recent Labs  Lab 11/05/19 0740 11/06/19 0927 11/07/19 1932 11/09/19 1323 11/10/19 1001  WBC 14.0* 16.4* 11.5* 7.7 7.6  HGB 8.7* 7.8* 8.2* 7.2* 7.7*  HCT 27.9* 25.4* 25.5* 23.4* 24.9*  MCV 94.6 94.8 91.1 91.4 91.5  PLT 270 209 195 122* 126*    Cardiac Enzymes: No results for input(s): CKTOTAL, CKMB, CKMBINDEX, TROPONINI in the last 168 hours.  BNP (last 3 results) No results for input(s): BNP in the last 8760 hours.  ProBNP (last 3 results) No results for input(s): PROBNP in the last 8760 hours.  Radiological Exams: DG CHEST PORT 1 VIEW  Result Date: 11/09/2019 CLINICAL DATA:  Fever EXAM: PORTABLE CHEST 1 VIEW COMPARISON:  11/04/2019 FINDINGS: New patchy opacities in the lung bases. No visible pleural effusions. No pneumothorax. Tracheostomy tube in similar position, projecting at midline. Gastric tube courses below the diaphragm in outside the field of view. Cardiac silhouette is within normal limits. IMPRESSION: New patchy opacities in lung bases, possibly representing early pneumonia given the clinical history. Electronically Signed   By: Feliberto Harts MD   On: 11/09/2019 11:41   DG Abd Portable 1V  Result Date:  11/10/2019 CLINICAL DATA:  Follow-up ileus/distended bowel. EXAM: PORTABLE ABDOMEN - 1 VIEW COMPARISON:  11/08/2019 radiograph FINDINGS: An NG tube tip is noted overlying the distal stomach. Gaseous distension of the colon has slightly decreased with cecum now measuring 11 cm, previously 13 cm. No dilated small bowel loops are noted. No other interval change. IMPRESSION: Slightly decreased gaseous distension of the colon. Electronically Signed   By: Harmon Pier M.D.   On: 11/10/2019 06:41   US Abdomen Limited RUQ  Result Date: 11/09/2019 CLINICAL DATA:  Pain EXAM: ULTRASOUND ABDOMEN LIMITED RIGHT UPPER QUADRANT COMPARISON:  CT dated 11/01/2019 FINDINGS: Gallbladder: There is pericholecystic free fluid with gallbladder sludge.  The sonographic Eulah Pont sign is reported as negative. There is gallbladder wall thickening with the gallbladder wall measuring 4 mm. Common bile duct: Diameter: 4 mm Liver: No focal lesion identified. Within normal limits in parenchymal echogenicity. Portal vein is patent on color Doppler imaging with normal direction of blood flow towards the liver. Other: Incidentally noted is a right-sided pleural effusion. IMPRESSION: 1. Findings are equivocal for acute cholecystitis. No gallstones identified. If there is high clinical suspicion for a calculus acute cholecystitis, follow-up with HIDA scan is recommended. 2. Incidentally noted right-sided pleural effusion. Electronically Signed   By: Katherine Mantle M.D.   On: 11/09/2019 21:47    Assessment/Plan Active Problems:   Acute on chronic respiratory failure with hypoxia (HCC)   Chronic atrial fibrillation (HCC)   Seizure disorder (HCC)   Metabolic encephalopathy   COPD, severe (HCC)   1. Acute on chronic respiratory failure with hypoxia we will continue pressure support titrate oxygen continue pulmonary toilet. 2. Chronic atrial fibrillation rate is controlled 3. Seizure disorder no active changes noted 4. Metabolic encephalopathy slow to improve 5. Severe COPD at baseline   I have personally seen and evaluated the patient, evaluated laboratory and imaging results, formulated the assessment and plan and placed orders. The Patient requires high complexity decision making with multiple systems involvement.  Rounds were done with the Respiratory Therapy Director and Staff therapists and discussed with nursing staff also.  Yevonne Pax, MD [facility] Pulmonary Critical Care Medicine Sleep Medicine

## 2019-11-11 ENCOUNTER — Other Ambulatory Visit (HOSPITAL_COMMUNITY): Payer: BC Managed Care – PPO

## 2019-11-11 DIAGNOSIS — J449 Chronic obstructive pulmonary disease, unspecified: Secondary | ICD-10-CM | POA: Diagnosis not present

## 2019-11-11 DIAGNOSIS — I482 Chronic atrial fibrillation, unspecified: Secondary | ICD-10-CM | POA: Diagnosis not present

## 2019-11-11 DIAGNOSIS — J9621 Acute and chronic respiratory failure with hypoxia: Secondary | ICD-10-CM | POA: Diagnosis not present

## 2019-11-11 DIAGNOSIS — G9341 Metabolic encephalopathy: Secondary | ICD-10-CM | POA: Diagnosis not present

## 2019-11-11 LAB — BASIC METABOLIC PANEL
Anion gap: 6 (ref 5–15)
BUN: 31 mg/dL — ABNORMAL HIGH (ref 6–20)
CO2: 20 mmol/L — ABNORMAL LOW (ref 22–32)
Calcium: 7.1 mg/dL — ABNORMAL LOW (ref 8.9–10.3)
Chloride: 116 mmol/L — ABNORMAL HIGH (ref 98–111)
Creatinine, Ser: 1.4 mg/dL — ABNORMAL HIGH (ref 0.61–1.24)
GFR calc Af Amer: >60 mL/min (ref >60)
GFR calc non Af Amer: 54 mL/min — ABNORMAL LOW (ref >60)
Glucose, Bld: 142 mg/dL — ABNORMAL HIGH (ref 70–99)
Potassium: 3.5 mmol/L (ref 3.5–5.1)
Sodium: 142 mmol/L (ref 135–145)

## 2019-11-11 LAB — MAGNESIUM: Magnesium: 1.8 mg/dL (ref 1.7–2.4)

## 2019-11-11 LAB — AMMONIA: Ammonia: 60 umol/L — ABNORMAL HIGH (ref 9–35)

## 2019-11-11 LAB — TRIGLYCERIDES: Triglycerides: 83 mg/dL (ref <150)

## 2019-11-11 NOTE — Progress Notes (Signed)
Pulmonary Critical Care Medicine [facility] [place]   PULMONARY CRITICAL CARE SERVICE  PROGRESS NOTE  Date of Service: 11/11/2019  Connor Roach  FXT:[id number]  DOB: 05/27/1960   DOA: 10/30/2019  Referring Physician: Carron Curie, MD  HPI: Connor Roach is a 59 y.o. male seen for follow up of Acute on Chronic Respiratory Failure.  Patient currently is on pressure support mode is doing well have been trying on T collar today  Medications: Reviewed on Rounds  Physical Exam:  Vitals: Temperature is 100.1 pulse 74 respiratory rate is 11 blood pressure is 118/60 saturations 98%  Ventilator Settings on pressure support FiO2 is 28% tidal volume 734 PEEP 5   General: Comfortable at this time  Eyes: Grossly normal lids, irises & conjunctiva  ENT: grossly tongue is normal  Neck: no obvious mass  Cardiovascular: S1 S2 normal no gallop  Respiratory: No rhonchi no rales noted at this time  Abdomen: soft  Skin: no rash seen on limited exam  Musculoskeletal: not rigid  Psychiatric:unable to assess  Neurologic: no seizure no involuntary movements         Lab Data:   Basic Metabolic Panel: Recent Labs  Lab 11/05/19 0740 11/05/19 0740 11/07/19 1932 11/08/19 1552 11/09/19 1323 11/10/19 1001 11/11/19 1004  NA 145   < > 148* 145 142 143 142  K 4.2   < > 2.7* 2.5* 3.2* 3.2* 3.5  CL 112*   < > 115* 115* 116* 116* 116*  CO2 21*   < > 23 22 21* 20* 20*  GLUCOSE 130*   < > 118* 132* 111* 125* 142*  BUN 51*   < > 29* 32* 31* 29* 31*  CREATININE 1.20   < > 1.26* 1.36* 1.43* 1.28* 1.40*  CALCIUM 7.6*   < > 7.3* 7.1* 7.2* 7.3* 7.1*  MG 2.6*  --  2.4  --  2.3 1.9 1.8  PHOS 4.5  --  2.3*  --   --  1.9*  --    < > = values in this interval not displayed.    ABG: No results for input(s): PHART, PCO2ART, PO2ART, HCO3, O2SAT in the last 168 hours.  Liver Function Tests: Recent Labs  Lab 11/05/19 0740  ALBUMIN 1.6*   No results for input(s): LIPASE, AMYLASE  in the last 168 hours. Recent Labs  Lab 11/07/19 1932 11/11/19 1004  AMMONIA 53* 60*    CBC: Recent Labs  Lab 11/05/19 0740 11/06/19 0927 11/07/19 1932 11/09/19 1323 11/10/19 1001  WBC 14.0* 16.4* 11.5* 7.7 7.6  HGB 8.7* 7.8* 8.2* 7.2* 7.7*  HCT 27.9* 25.4* 25.5* 23.4* 24.9*  MCV 94.6 94.8 91.1 91.4 91.5  PLT 270 209 195 122* 126*    Cardiac Enzymes: No results for input(s): CKTOTAL, CKMB, CKMBINDEX, TROPONINI in the last 168 hours.  BNP (last 3 results) No results for input(s): BNP in the last 8760 hours.  ProBNP (last 3 results) No results for input(s): PROBNP in the last 8760 hours.  Radiological Exams: DG Abd Portable 1V  Result Date: 11/10/2019 CLINICAL DATA:  Follow-up ileus/distended bowel. EXAM: PORTABLE ABDOMEN - 1 VIEW COMPARISON:  11/08/2019 radiograph FINDINGS: An NG tube tip is noted overlying the distal stomach. Gaseous distension of the colon has slightly decreased with cecum now measuring 11 cm, previously 13 cm. No dilated small bowel loops are noted. No other interval change. IMPRESSION: Slightly decreased gaseous distension of the colon. Electronically Signed   By: Henrietta Hoover.D.  On: 11/10/2019 06:41   US Abdomen Limited RUQ  Result Date: 11/09/2019 CLINICAL DATA:  Pain EXAM: ULTRASOUND ABDOMEN LIMITED RIGHT UPPER QUADRANT COMPARISON:  CT dated 11/01/2019 FINDINGS: Gallbladder: There is pericholecystic free fluid with gallbladder sludge. The sonographic Eulah Pont sign is reported as negative. There is gallbladder wall thickening with the gallbladder wall measuring 4 mm. Common bile duct: Diameter: 4 mm Liver: No focal lesion identified. Within normal limits in parenchymal echogenicity. Portal vein is patent on color Doppler imaging with normal direction of blood flow towards the liver. Other: Incidentally noted is a right-sided pleural effusion. IMPRESSION: 1. Findings are equivocal for acute cholecystitis. No gallstones identified. If there is high clinical  suspicion for a calculus acute cholecystitis, follow-up with HIDA scan is recommended. 2. Incidentally noted right-sided pleural effusion. Electronically Signed   By: Katherine Mantle M.D.   On: 11/09/2019 21:47    Assessment/Plan Active Problems:   Acute on chronic respiratory failure with hypoxia (HCC)   Chronic atrial fibrillation (HCC)   Seizure disorder (HCC)   Metabolic encephalopathy   COPD, severe (HCC)   1. Acute on chronic respiratory failure with hypoxia we will continue with on weaning on pressure support titrate oxygen continue pulmonary toilet. 2. Chronic atrial fibrillation rate is controlled we will monitor 3. Seizure disorder no active seizure noted at this time 4. Metabolic encephalopathy supportive care 5. Severe COPD at baseline   I have personally seen and evaluated the patient, evaluated laboratory and imaging results, formulated the assessment and plan and placed orders. The Patient requires high complexity decision making with multiple systems involvement.  Rounds were done with the Respiratory Therapy Director and Staff therapists and discussed with nursing staff also.  Yevonne Pax, MD [facility] Pulmonary Critical Care Medicine Sleep Medicine

## 2019-11-12 ENCOUNTER — Other Ambulatory Visit (HOSPITAL_COMMUNITY): Payer: BC Managed Care – PPO

## 2019-11-12 DIAGNOSIS — J449 Chronic obstructive pulmonary disease, unspecified: Secondary | ICD-10-CM | POA: Diagnosis not present

## 2019-11-12 DIAGNOSIS — I482 Chronic atrial fibrillation, unspecified: Secondary | ICD-10-CM | POA: Diagnosis not present

## 2019-11-12 DIAGNOSIS — J9621 Acute and chronic respiratory failure with hypoxia: Secondary | ICD-10-CM | POA: Diagnosis not present

## 2019-11-12 DIAGNOSIS — G9341 Metabolic encephalopathy: Secondary | ICD-10-CM | POA: Diagnosis not present

## 2019-11-12 LAB — COMPREHENSIVE METABOLIC PANEL
ALT: 14 U/L (ref 0–44)
AST: 13 U/L — ABNORMAL LOW (ref 15–41)
Albumin: 1 g/dL — ABNORMAL LOW (ref 3.5–5.0)
Alkaline Phosphatase: 36 U/L — ABNORMAL LOW (ref 38–126)
Anion gap: 5 (ref 5–15)
BUN: 29 mg/dL — ABNORMAL HIGH (ref 6–20)
CO2: 21 mmol/L — ABNORMAL LOW (ref 22–32)
Calcium: 7.3 mg/dL — ABNORMAL LOW (ref 8.9–10.3)
Chloride: 117 mmol/L — ABNORMAL HIGH (ref 98–111)
Creatinine, Ser: 1.2 mg/dL (ref 0.61–1.24)
GFR calc Af Amer: >60 mL/min (ref >60)
GFR calc non Af Amer: >60 mL/min (ref >60)
Glucose, Bld: 158 mg/dL — ABNORMAL HIGH (ref 70–99)
Potassium: 3.6 mmol/L (ref 3.5–5.1)
Sodium: 143 mmol/L (ref 135–145)
Total Bilirubin: 0.3 mg/dL (ref 0.3–1.2)
Total Protein: 4.2 g/dL — ABNORMAL LOW (ref 6.5–8.1)

## 2019-11-12 LAB — BLOOD GAS, ARTERIAL
Acid-base deficit: 4.5 mmol/L — ABNORMAL HIGH (ref 0.0–2.0)
Bicarbonate: 21.1 mmol/L (ref 20.0–28.0)
FIO2: 28
O2 Saturation: 96.5 %
Patient temperature: 37
pCO2 arterial: 45.3 mmHg (ref 32.0–48.0)
pH, Arterial: 7.289 — ABNORMAL LOW (ref 7.350–7.450)
pO2, Arterial: 81.3 mmHg — ABNORMAL LOW (ref 83.0–108.0)

## 2019-11-12 LAB — CBC
HCT: 23.3 % — ABNORMAL LOW (ref 39.0–52.0)
Hemoglobin: 7.3 g/dL — ABNORMAL LOW (ref 13.0–17.0)
MCH: 29 pg (ref 26.0–34.0)
MCHC: 31.3 g/dL (ref 30.0–36.0)
MCV: 92.5 fL (ref 80.0–100.0)
Platelets: 130 10*3/uL — ABNORMAL LOW (ref 150–400)
RBC: 2.52 MIL/uL — ABNORMAL LOW (ref 4.22–5.81)
RDW: 15.2 % (ref 11.5–15.5)
WBC: 7.3 10*3/uL (ref 4.0–10.5)
nRBC: 0 % (ref 0.0–0.2)

## 2019-11-12 LAB — MAGNESIUM: Magnesium: 1.7 mg/dL (ref 1.7–2.4)

## 2019-11-12 NOTE — Progress Notes (Signed)
Pulmonary Critical Care Medicine [facility] [place]   PULMONARY CRITICAL CARE SERVICE  PROGRESS NOTE  Date of Service: 11/12/2019  Connor Roach  ZOX:[id number]  DOB: 1960-06-10   DOA: 10/30/2019  Referring Physician: Carron Curie, MD  HPI: Connor Roach is a 59 y.o. male seen for follow up of Acute on Chronic Respiratory Failure.  Patient at this time is on T collar has been on 28% FiO2 the goal today is 24 hours  Medications: Reviewed on Rounds  Physical Exam:  Vitals: Temperature is 99.0 pulse 77 respiratory 12 blood pressure is 122/65 saturations 93%  Ventilator Settings off the ventilator on T collar  . General: Comfortable at this time . Eyes: Grossly normal lids, irises & conjunctiva . ENT: grossly tongue is normal . Neck: no obvious mass . Cardiovascular: S1 S2 normal no gallop . Respiratory: No rhonchi no rales are noted at this time . Abdomen: soft . Skin: no rash seen on limited exam . Musculoskeletal: not rigid . Psychiatric:unable to assess . Neurologic: no seizure no involuntary movements         Lab Data:   Basic Metabolic Panel: Recent Labs  Lab 11/07/19 1932 11/08/19 1552 11/09/19 1323 11/10/19 1001 11/11/19 1004  NA 148* 145 142 143 142  K 2.7* 2.5* 3.2* 3.2* 3.5  CL 115* 115* 116* 116* 116*  CO2 23 22 21* 20* 20*  GLUCOSE 118* 132* 111* 125* 142*  BUN 29* 32* 31* 29* 31*  CREATININE 1.26* 1.36* 1.43* 1.28* 1.40*  CALCIUM 7.3* 7.1* 7.2* 7.3* 7.1*  MG 2.4  --  2.3 1.9 1.8  PHOS 2.3*  --   --  1.9*  --     ABG: No results for input(s): PHART, PCO2ART, PO2ART, HCO3, O2SAT in the last 168 hours.  Liver Function Tests: No results for input(s): AST, ALT, ALKPHOS, BILITOT, PROT, ALBUMIN in the last 168 hours. No results for input(s): LIPASE, AMYLASE in the last 168 hours. Recent Labs  Lab 11/07/19 1932 11/11/19 1004  AMMONIA 53* 60*    CBC: Recent Labs  Lab 11/06/19 0927 11/07/19 1932 11/09/19 1323 11/10/19 1001   WBC 16.4* 11.5* 7.7 7.6  HGB 7.8* 8.2* 7.2* 7.7*  HCT 25.4* 25.5* 23.4* 24.9*  MCV 94.8 91.1 91.4 91.5  PLT 209 195 122* 126*    Cardiac Enzymes: No results for input(s): CKTOTAL, CKMB, CKMBINDEX, TROPONINI in the last 168 hours.  BNP (last 3 results) No results for input(s): BNP in the last 8760 hours.  ProBNP (last 3 results) No results for input(s): PROBNP in the last 8760 hours.  Radiological Exams: US RENAL  Result Date: 11/11/2019 CLINICAL DATA:  Elevated BUN. EXAM: RENAL / URINARY TRACT ULTRASOUND COMPLETE COMPARISON:  CT 11/01/2019 FINDINGS: Right Kidney: Renal measurements: 10.5 x 5.0 x 5.5 cm = volume: 144 mL. Echogenicity within normal limits. Small cyst centrally measuring 1.4 cm. No suspicious mass or hydronephrosis visualized. Left Kidney: Renal measurements: 11.7 x 6.2 x 6.1 cm = volume: 331 mL. Echogenicity within normal limits. No mass or hydronephrosis visualized. Bladder: Foley catheter with minimal distension. No obvious focal abnormality. Other: None. IMPRESSION: 1. No obstructive uropathy or hydronephrosis. 2. Small right renal cyst. Otherwise unremarkable sonographic appearance of the kidneys. 3. Foley catheter within the bladder which is essentially decompressed. Electronically Signed   By: Narda Rutherford M.D.   On: 11/11/2019 16:03   DG Abd Portable 1V  Result Date: 11/12/2019 CLINICAL DATA:  Follow-up ileus. EXAM: PORTABLE ABDOMEN - 1 VIEW  COMPARISON:  Abdomen 11/10/2019 11/10/2019.  CT 11/01/2019. FINDINGS: NG tube noted with tip over the stomach. Persistent colonic distention again noted without significant interim change. No free air. Degenerative change lumbar spine. IMPRESSION: NG tube noted with tip over the stomach. Persistent colonic distention again noted without significant interim change. Electronically Signed   By: Maisie Fus  Register   On: 11/12/2019 07:09    Assessment/Plan Active Problems:   Acute on chronic respiratory failure with hypoxia  (HCC)   Chronic atrial fibrillation (HCC)   Seizure disorder (HCC)   Metabolic encephalopathy   COPD, severe (HCC)   1. Acute on chronic respiratory failure with hypoxia we will continue with T collar trials 28% patient's goal is 24 hours. 2. Chronic atrial fibrillation rate is controlled 3. Seizure disorder no active seizures will monitor 4. Metabolic encephalopathy grossly unchanged 5. Severe COPD at baseline we will continue to follow along   I have personally seen and evaluated the patient, evaluated laboratory and imaging results, formulated the assessment and plan and placed orders. The Patient requires high complexity decision making with multiple systems involvement.  Rounds were done with the Respiratory Therapy Director and Staff therapists and discussed with nursing staff also.  Yevonne Pax, MD [facility] Pulmonary Critical Care Medicine Sleep Medicine

## 2019-11-13 ENCOUNTER — Other Ambulatory Visit (HOSPITAL_COMMUNITY): Payer: BC Managed Care – PPO

## 2019-11-13 DIAGNOSIS — J9621 Acute and chronic respiratory failure with hypoxia: Secondary | ICD-10-CM | POA: Diagnosis not present

## 2019-11-13 DIAGNOSIS — I482 Chronic atrial fibrillation, unspecified: Secondary | ICD-10-CM | POA: Diagnosis not present

## 2019-11-13 DIAGNOSIS — G9341 Metabolic encephalopathy: Secondary | ICD-10-CM | POA: Diagnosis not present

## 2019-11-13 DIAGNOSIS — J449 Chronic obstructive pulmonary disease, unspecified: Secondary | ICD-10-CM | POA: Diagnosis not present

## 2019-11-13 LAB — RENAL FUNCTION PANEL
Albumin: 1.2 g/dL — ABNORMAL LOW (ref 3.5–5.0)
Anion gap: 4 — ABNORMAL LOW (ref 5–15)
BUN: 30 mg/dL — ABNORMAL HIGH (ref 6–20)
CO2: 22 mmol/L (ref 22–32)
Calcium: 7.4 mg/dL — ABNORMAL LOW (ref 8.9–10.3)
Chloride: 116 mmol/L — ABNORMAL HIGH (ref 98–111)
Creatinine, Ser: 1.14 mg/dL (ref 0.61–1.24)
GFR calc Af Amer: >60 mL/min (ref >60)
GFR calc non Af Amer: >60 mL/min (ref >60)
Glucose, Bld: 106 mg/dL — ABNORMAL HIGH (ref 70–99)
Phosphorus: 2.4 mg/dL — ABNORMAL LOW (ref 2.5–4.6)
Potassium: 3.7 mmol/L (ref 3.5–5.1)
Sodium: 142 mmol/L (ref 135–145)

## 2019-11-13 LAB — CBC
HCT: 23.8 % — ABNORMAL LOW (ref 39.0–52.0)
Hemoglobin: 7.4 g/dL — ABNORMAL LOW (ref 13.0–17.0)
MCH: 28.9 pg (ref 26.0–34.0)
MCHC: 31.1 g/dL (ref 30.0–36.0)
MCV: 93 fL (ref 80.0–100.0)
Platelets: 165 10*3/uL (ref 150–400)
RBC: 2.56 MIL/uL — ABNORMAL LOW (ref 4.22–5.81)
RDW: 15.4 % (ref 11.5–15.5)
WBC: 8.9 10*3/uL (ref 4.0–10.5)
nRBC: 0 % (ref 0.0–0.2)

## 2019-11-13 LAB — MAGNESIUM: Magnesium: 1.7 mg/dL (ref 1.7–2.4)

## 2019-11-13 MED ORDER — TECHNETIUM TC 99M MEBROFENIN IV KIT
5.0000 | PACK | Freq: Once | INTRAVENOUS | Status: AC | PRN
  Administered 2019-11-13: 5 via INTRAVENOUS

## 2019-11-13 NOTE — Progress Notes (Addendum)
Pulmonary Critical Care Medicine [facility] [place]   PULMONARY CRITICAL CARE SERVICE  PROGRESS NOTE  Date of Service: 11/13/2019  Connor Roach  [id number]  DOB: [birth year]   DOA: 10/30/2019  Referring Physician: Carron Curie, MD  HPI: Connor Roach is a 59 y.o. male seen for follow up of Acute on Chronic Respiratory Failure.  Patient remains on aerosol trach collar 28% at this time has done 24 hours.  No fever or distress noted.  Medications: Reviewed on Rounds  Physical Exam:  Vitals: Pulse 80 respirations 30 BP 108/52 O2 sat 99% temp 99.1  Ventilator Settings ATC 28%  . General: Comfortable at this time . Eyes: Grossly normal lids, irises & conjunctiva . ENT: grossly tongue is normal . Neck: no obvious mass . Cardiovascular: S1 S2 normal no gallop . Respiratory: No rales or rhonchi noted . Abdomen: soft . Skin: no rash seen on limited exam . Musculoskeletal: not rigid . Psychiatric:unable to assess . Neurologic: no seizure no involuntary movements         Lab Data:   Basic Metabolic Panel: Recent Labs  Lab 11/07/19 1932 11/08/19 1552 11/09/19 1323 11/10/19 1001 11/11/19 1004 11/12/19 1115 11/13/19 0956  NA 148*   < > 142 143 142 143 142  K 2.7*   < > 3.2* 3.2* 3.5 3.6 3.7  CL 115*   < > 116* 116* 116* 117* 116*  CO2 23   < > 21* 20* 20* 21* 22  GLUCOSE 118*   < > 111* 125* 142* 158* 106*  BUN 29*   < > 31* 29* 31* 29* 30*  CREATININE 1.26*   < > 1.43* 1.28* 1.40* 1.20 1.14  CALCIUM 7.3*   < > 7.2* 7.3* 7.1* 7.3* 7.4*  MG 2.4  --  2.3 1.9 1.8 1.7 1.7  PHOS 2.3*  --   --  1.9*  --   --  2.4*   < > = values in this interval not displayed.    ABG: Recent Labs  Lab 11/12/19 1530  PHART 7.289*  PCO2ART 45.3  PO2ART 81.3*  HCO3 21.1  O2SAT 96.5    Liver Function Tests: Recent Labs  Lab 11/12/19 1115 11/13/19 0956  AST 13*  --   ALT 14  --   ALKPHOS 36*  --   BILITOT 0.3  --   PROT 4.2*  --   ALBUMIN 1.0* 1.2*   No  results for input(s): LIPASE, AMYLASE in the last 168 hours. Recent Labs  Lab 11/07/19 1932 11/11/19 1004  AMMONIA 53* 60*    CBC: Recent Labs  Lab 11/07/19 1932 11/09/19 1323 11/10/19 1001 11/12/19 1115  WBC 11.5* 7.7 7.6 7.3  HGB 8.2* 7.2* 7.7* 7.3*  HCT 25.5* 23.4* 24.9* 23.3*  MCV 91.1 91.4 91.5 92.5  PLT 195 122* 126* 130*    Cardiac Enzymes: No results for input(s): CKTOTAL, CKMB, CKMBINDEX, TROPONINI in the last 168 hours.  BNP (last 3 results) No results for input(s): BNP in the last 8760 hours.  ProBNP (last 3 results) No results for input(s): PROBNP in the last 8760 hours.  Radiological Exams: NM Hepatobiliary Liver Func  Result Date: 11/13/2019 CLINICAL DATA:  Chronic upper abdominal pain, abnormal ultrasound demonstrating pericholecystic fluid, gallbladder sludge and wall thickening question acute cholecystitis EXAM: NUCLEAR MEDICINE HEPATOBILIARY IMAGING TECHNIQUE: Sequential images of the abdomen were obtained out to 60 minutes following intravenous administration of radiopharmaceutical. RADIOPHARMACEUTICALS:  5.0 mCi Tc-67m  Choletec IV COMPARISON:  Ultrasound abdomen  11/09/2019 FINDINGS: Normal tracer clearance from bloodstream. Excretion of tracer into biliary tree with visualization of small bowel at 10 minutes. Gallbladder visualized at 30 minutes. No evidence of cystic duct or CBD obstruction. Fair amount of retained tracer within hepatic parenchyma at 45 minutes, which could reflect a degree of hepatocellular dysfunction. IMPRESSION: Patent biliary tree without evidence of cystic duct obstruction or acute cholecystitis. Question mild degree of hepatocellular dysfunction. Electronically Signed   By: Ulyses Southward M.D.   On: 11/13/2019 10:55   US RENAL  Result Date: 11/11/2019 CLINICAL DATA:  Elevated BUN. EXAM: RENAL / URINARY TRACT ULTRASOUND COMPLETE COMPARISON:  CT 11/01/2019 FINDINGS: Right Kidney: Renal measurements: 10.5 x 5.0 x 5.5 cm = volume: 144 mL.  Echogenicity within normal limits. Small cyst centrally measuring 1.4 cm. No suspicious mass or hydronephrosis visualized. Left Kidney: Renal measurements: 11.7 x 6.2 x 6.1 cm = volume: 331 mL. Echogenicity within normal limits. No mass or hydronephrosis visualized. Bladder: Foley catheter with minimal distension. No obvious focal abnormality. Other: None. IMPRESSION: 1. No obstructive uropathy or hydronephrosis. 2. Small right renal cyst. Otherwise unremarkable sonographic appearance of the kidneys. 3. Foley catheter within the bladder which is essentially decompressed. Electronically Signed   By: Narda Rutherford M.D.   On: 11/11/2019 16:03   DG Abd Portable 1V  Result Date: 11/12/2019 CLINICAL DATA:  Follow-up ileus. EXAM: PORTABLE ABDOMEN - 1 VIEW COMPARISON:  Abdomen 11/10/2019 11/10/2019.  CT 11/01/2019. FINDINGS: NG tube noted with tip over the stomach. Persistent colonic distention again noted without significant interim change. No free air. Degenerative change lumbar spine. IMPRESSION: NG tube noted with tip over the stomach. Persistent colonic distention again noted without significant interim change. Electronically Signed   By: Maisie Fus  Register   On: 11/12/2019 07:09    Assessment/Plan Active Problems:   Acute on chronic respiratory failure with hypoxia (HCC)   Chronic atrial fibrillation (HCC)   Seizure disorder (HCC)   Metabolic encephalopathy   COPD, severe (HCC)   1. Acute on chronic respiratory failure with hypoxia we will continue with T collar trials 28% patient's goal is 24 hours. 2. Chronic atrial fibrillation rate is controlled 3. Seizure disorder no active seizures will monitor 4. Metabolic encephalopathy grossly unchanged 5. Severe COPD at baseline we will continue to follow along   I have personally seen and evaluated the patient, evaluated laboratory and imaging results, formulated the assessment and plan and placed orders. The Patient requires high complexity  decision making with multiple systems involvement.  Rounds were done with the Respiratory Therapy Director and Staff therapists and discussed with nursing staff also.  Yevonne Pax, MD [facility] Pulmonary Critical Care Medicine Sleep Medicine

## 2019-11-14 ENCOUNTER — Other Ambulatory Visit (HOSPITAL_COMMUNITY): Payer: BC Managed Care – PPO

## 2019-11-14 DIAGNOSIS — G9341 Metabolic encephalopathy: Secondary | ICD-10-CM | POA: Diagnosis not present

## 2019-11-14 DIAGNOSIS — I482 Chronic atrial fibrillation, unspecified: Secondary | ICD-10-CM | POA: Diagnosis not present

## 2019-11-14 DIAGNOSIS — J449 Chronic obstructive pulmonary disease, unspecified: Secondary | ICD-10-CM | POA: Diagnosis not present

## 2019-11-14 DIAGNOSIS — J9621 Acute and chronic respiratory failure with hypoxia: Secondary | ICD-10-CM | POA: Diagnosis not present

## 2019-11-14 LAB — MAGNESIUM: Magnesium: 1.7 mg/dL (ref 1.7–2.4)

## 2019-11-14 LAB — RENAL FUNCTION PANEL
Albumin: 1.1 g/dL — ABNORMAL LOW (ref 3.5–5.0)
Anion gap: 4 — ABNORMAL LOW (ref 5–15)
BUN: 27 mg/dL — ABNORMAL HIGH (ref 6–20)
CO2: 23 mmol/L (ref 22–32)
Calcium: 7.5 mg/dL — ABNORMAL LOW (ref 8.9–10.3)
Chloride: 115 mmol/L — ABNORMAL HIGH (ref 98–111)
Creatinine, Ser: 1.03 mg/dL (ref 0.61–1.24)
GFR calc Af Amer: >60 mL/min (ref >60)
GFR calc non Af Amer: >60 mL/min (ref >60)
Glucose, Bld: 158 mg/dL — ABNORMAL HIGH (ref 70–99)
Phosphorus: 2 mg/dL — ABNORMAL LOW (ref 2.5–4.6)
Potassium: 3.4 mmol/L — ABNORMAL LOW (ref 3.5–5.1)
Sodium: 142 mmol/L (ref 135–145)

## 2019-11-14 LAB — CBC
HCT: 22.8 % — ABNORMAL LOW (ref 39.0–52.0)
Hemoglobin: 7 g/dL — ABNORMAL LOW (ref 13.0–17.0)
MCH: 27.9 pg (ref 26.0–34.0)
MCHC: 30.7 g/dL (ref 30.0–36.0)
MCV: 90.8 fL (ref 80.0–100.0)
Platelets: 177 10*3/uL (ref 150–400)
RBC: 2.51 MIL/uL — ABNORMAL LOW (ref 4.22–5.81)
RDW: 15 % (ref 11.5–15.5)
WBC: 9.2 10*3/uL (ref 4.0–10.5)
nRBC: 0.2 % (ref 0.0–0.2)

## 2019-11-14 LAB — BLOOD GAS, ARTERIAL
Acid-base deficit: 2.9 mmol/L — ABNORMAL HIGH (ref 0.0–2.0)
Bicarbonate: 21.9 mmol/L (ref 20.0–28.0)
FIO2: 28
O2 Saturation: 96 %
Patient temperature: 37.8
pCO2 arterial: 43 mmHg (ref 32.0–48.0)
pH, Arterial: 7.332 — ABNORMAL LOW (ref 7.350–7.450)
pO2, Arterial: 80.9 mmHg — ABNORMAL LOW (ref 83.0–108.0)

## 2019-11-14 LAB — PREPARE RBC (CROSSMATCH)

## 2019-11-14 NOTE — Progress Notes (Addendum)
Pulmonary Critical Care Medicine [facility] [place]   PULMONARY CRITICAL CARE SERVICE  PROGRESS NOTE  Date of Service: 11/14/2019  Connor Roach  UXY:[id number]  DOB: [birth year]   DOA: 10/30/2019  Referring Physician: Carron Curie, MD  HPI: Connor Roach is a 59 y.o. male seen for follow up of Acute on Chronic Respiratory Failure. Patient remains on 28% aerosol trach collar a 48-hour goal at this time has completed 24 hours.  Medications: Reviewed on Rounds  Physical Exam:  Vitals: Pulse 81 respirations 13 BP 139/66 O2 sat 98% temp 99.4  Ventilator Settings ATC 28%  . General: Comfortable at this time . Eyes: Grossly normal lids, irises & conjunctiva . ENT: grossly tongue is normal . Neck: no obvious mass . Cardiovascular: S1 S2 normal no gallop . Respiratory: No rales or rhonchi noted . Abdomen: soft . Skin: no rash seen on limited exam . Musculoskeletal: not rigid . Psychiatric:unable to assess . Neurologic: no seizure no involuntary movements         Lab Data:   Basic Metabolic Panel: Recent Labs  Lab 11/07/19 1932 11/08/19 1552 11/09/19 1323 11/10/19 1001 11/11/19 1004 11/12/19 1115 11/13/19 0956  NA 148*   < > 142 143 142 143 142  K 2.7*   < > 3.2* 3.2* 3.5 3.6 3.7  CL 115*   < > 116* 116* 116* 117* 116*  CO2 23   < > 21* 20* 20* 21* 22  GLUCOSE 118*   < > 111* 125* 142* 158* 106*  BUN 29*   < > 31* 29* 31* 29* 30*  CREATININE 1.26*   < > 1.43* 1.28* 1.40* 1.20 1.14  CALCIUM 7.3*   < > 7.2* 7.3* 7.1* 7.3* 7.4*  MG 2.4  --  2.3 1.9 1.8 1.7 1.7  PHOS 2.3*  --   --  1.9*  --   --  2.4*   < > = values in this interval not displayed.    ABG: Recent Labs  Lab 11/12/19 1530 11/14/19 0856  PHART 7.289* 7.332*  PCO2ART 45.3 43.0  PO2ART 81.3* 80.9*  HCO3 21.1 21.9  O2SAT 96.5 96.0    Liver Function Tests: Recent Labs  Lab 11/12/19 1115 11/13/19 0956  AST 13*  --   ALT 14  --   ALKPHOS 36*  --   BILITOT 0.3  --   PROT 4.2*  --    ALBUMIN 1.0* 1.2*   No results for input(s): LIPASE, AMYLASE in the last 168 hours. Recent Labs  Lab 11/07/19 1932 11/11/19 1004  AMMONIA 53* 60*    CBC: Recent Labs  Lab 11/07/19 1932 11/09/19 1323 11/10/19 1001 11/12/19 1115 11/13/19 0956  WBC 11.5* 7.7 7.6 7.3 8.9  HGB 8.2* 7.2* 7.7* 7.3* 7.4*  HCT 25.5* 23.4* 24.9* 23.3* 23.8*  MCV 91.1 91.4 91.5 92.5 93.0  PLT 195 122* 126* 130* 165    Cardiac Enzymes: No results for input(s): CKTOTAL, CKMB, CKMBINDEX, TROPONINI in the last 168 hours.  BNP (last 3 results) No results for input(s): BNP in the last 8760 hours.  ProBNP (last 3 results) No results for input(s): PROBNP in the last 8760 hours.  Radiological Exams: NM Hepatobiliary Liver Func  Result Date: 11/13/2019 CLINICAL DATA:  Chronic upper abdominal pain, abnormal ultrasound demonstrating pericholecystic fluid, gallbladder sludge and wall thickening question acute cholecystitis EXAM: NUCLEAR MEDICINE HEPATOBILIARY IMAGING TECHNIQUE: Sequential images of the abdomen were obtained out to 60 minutes following intravenous administration of radiopharmaceutical. RADIOPHARMACEUTICALS:  5.0 mCi Tc-78m  Choletec IV COMPARISON:  Ultrasound abdomen 11/09/2019 FINDINGS: Normal tracer clearance from bloodstream. Excretion of tracer into biliary tree with visualization of small bowel at 10 minutes. Gallbladder visualized at 30 minutes. No evidence of cystic duct or CBD obstruction. Fair amount of retained tracer within hepatic parenchyma at 45 minutes, which could reflect a degree of hepatocellular dysfunction. IMPRESSION: Patent biliary tree without evidence of cystic duct obstruction or acute cholecystitis. Question mild degree of hepatocellular dysfunction. Electronically Signed   By: Ulyses Southward M.D.   On: 11/13/2019 10:55   DG Abd Portable 1V  Result Date: 11/14/2019 CLINICAL DATA:  Ileus EXAM: PORTABLE ABDOMEN - 1 VIEW COMPARISON:  11/12/2019 FINDINGS: Enteric tube with tip  in the right upper quadrant consistent with location in the distal stomach. Moderately distended gas-filled colon. No significant gas in the small bowel. Changes likely to represent colonic ileus. No significant change since prior study. IMPRESSION: Enteric tube tip in the right upper quadrant consistent with location in the distal stomach. Persistent colonic ileus. Electronically Signed   By: Burman Nieves M.D.   On: 11/14/2019 06:24    Assessment/Plan Active Problems:   Acute on chronic respiratory failure with hypoxia (HCC)   Chronic atrial fibrillation (HCC)   Seizure disorder (HCC)   Metabolic encephalopathy   COPD, severe (HCC)   1. Acute on chronic respiratory failure with hypoxia patient is going for 48-hour goal at this time on 20% aerosol trach collar currently satting well we'll continue aggressive pulmonary toilet supportive measures. 2. Chronic atrial fibrillation rate is controlled 3. Seizure disorder no active seizures will monitor 4. Metabolic encephalopathy grossly unchanged 5. Severe COPD at baseline we will continue to follow along   I have personally seen and evaluated the patient, evaluated laboratory and imaging results, formulated the assessment and plan and placed orders. The Patient requires high complexity decision making with multiple systems involvement.  Rounds were done with the Respiratory Therapy Director and Staff therapists and discussed with nursing staff also.  Connor Pax, MD [facility] Pulmonary Critical Care Medicine Sleep Medicine

## 2019-11-15 DIAGNOSIS — G9341 Metabolic encephalopathy: Secondary | ICD-10-CM | POA: Diagnosis not present

## 2019-11-15 DIAGNOSIS — I482 Chronic atrial fibrillation, unspecified: Secondary | ICD-10-CM | POA: Diagnosis not present

## 2019-11-15 DIAGNOSIS — J449 Chronic obstructive pulmonary disease, unspecified: Secondary | ICD-10-CM | POA: Diagnosis not present

## 2019-11-15 DIAGNOSIS — J9621 Acute and chronic respiratory failure with hypoxia: Secondary | ICD-10-CM | POA: Diagnosis not present

## 2019-11-15 LAB — CBC
HCT: 26.3 % — ABNORMAL LOW (ref 39.0–52.0)
Hemoglobin: 8.5 g/dL — ABNORMAL LOW (ref 13.0–17.0)
MCH: 29.7 pg (ref 26.0–34.0)
MCHC: 32.3 g/dL (ref 30.0–36.0)
MCV: 92 fL (ref 80.0–100.0)
Platelets: 219 10*3/uL (ref 150–400)
RBC: 2.86 MIL/uL — ABNORMAL LOW (ref 4.22–5.81)
RDW: 15.1 % (ref 11.5–15.5)
WBC: 11 10*3/uL — ABNORMAL HIGH (ref 4.0–10.5)
nRBC: 0.2 % (ref 0.0–0.2)

## 2019-11-15 LAB — RENAL FUNCTION PANEL
Albumin: 1.2 g/dL — ABNORMAL LOW (ref 3.5–5.0)
Anion gap: 7 (ref 5–15)
BUN: 26 mg/dL — ABNORMAL HIGH (ref 6–20)
CO2: 23 mmol/L (ref 22–32)
Calcium: 7.6 mg/dL — ABNORMAL LOW (ref 8.9–10.3)
Chloride: 111 mmol/L (ref 98–111)
Creatinine, Ser: 0.97 mg/dL (ref 0.61–1.24)
GFR calc Af Amer: >60 mL/min (ref >60)
GFR calc non Af Amer: >60 mL/min (ref >60)
Glucose, Bld: 119 mg/dL — ABNORMAL HIGH (ref 70–99)
Phosphorus: 2 mg/dL — ABNORMAL LOW (ref 2.5–4.6)
Potassium: 3.8 mmol/L (ref 3.5–5.1)
Sodium: 141 mmol/L (ref 135–145)

## 2019-11-15 LAB — BPAM RBC
Blood Product Expiration Date: 202110232359
ISSUE DATE / TIME: 202109291723
Unit Type and Rh: 7300

## 2019-11-15 LAB — TYPE AND SCREEN
ABO/RH(D): B POS
Antibody Screen: NEGATIVE
Unit division: 0

## 2019-11-15 LAB — MAGNESIUM: Magnesium: 1.7 mg/dL (ref 1.7–2.4)

## 2019-11-15 NOTE — Progress Notes (Addendum)
Pulmonary Critical Care Medicine [facility] [place]   PULMONARY CRITICAL CARE SERVICE  PROGRESS NOTE  Date of Service: 11/15/2019  Connor Roach  [id number]  DOB: 07-27-1960   DOA: 10/30/2019  Referring Physician: Carron Curie, MD  HPI: Connor Roach is a 59 y.o. male seen for follow up of Acute on Chronic Respiratory Failure.  Patient remains on aerosol trach collar has completed 48 hours on 28% satting well no distress.  Medications: Reviewed on Rounds  Physical Exam:  Vitals: Pulse 75 respirations 16 BP 128/61 O2 sat 100% temp 98.7  Ventilator Settings 28% ATC  . General: Comfortable at this time . Eyes: Grossly normal lids, irises & conjunctiva . ENT: grossly tongue is normal . Neck: no obvious mass . Cardiovascular: S1 S2 normal no gallop . Respiratory: No rales or rhonchi noted . Abdomen: soft . Skin: no rash seen on limited exam . Musculoskeletal: not rigid . Psychiatric:unable to assess . Neurologic: no seizure no involuntary movements         Lab Data:   Basic Metabolic Panel: Recent Labs  Lab 11/10/19 1001 11/11/19 1004 11/12/19 1115 11/13/19 0956 11/14/19 1120  NA 143 142 143 142 142  K 3.2* 3.5 3.6 3.7 3.4*  CL 116* 116* 117* 116* 115*  CO2 20* 20* 21* 22 23  GLUCOSE 125* 142* 158* 106* 158*  BUN 29* 31* 29* 30* 27*  CREATININE 1.28* 1.40* 1.20 1.14 1.03  CALCIUM 7.3* 7.1* 7.3* 7.4* 7.5*  MG 1.9 1.8 1.7 1.7 1.7  PHOS 1.9*  --   --  2.4* 2.0*    ABG: Recent Labs  Lab 11/12/19 1530 11/14/19 0856  PHART 7.289* 7.332*  PCO2ART 45.3 43.0  PO2ART 81.3* 80.9*  HCO3 21.1 21.9  O2SAT 96.5 96.0    Liver Function Tests: Recent Labs  Lab 11/12/19 1115 11/13/19 0956 11/14/19 1120  AST 13*  --   --   ALT 14  --   --   ALKPHOS 36*  --   --   BILITOT 0.3  --   --   PROT 4.2*  --   --   ALBUMIN 1.0* 1.2* 1.1*   No results for input(s): LIPASE, AMYLASE in the last 168 hours. Recent Labs  Lab 11/11/19 1004  AMMONIA 60*     CBC: Recent Labs  Lab 11/09/19 1323 11/10/19 1001 11/12/19 1115 11/13/19 0956 11/14/19 1120  WBC 7.7 7.6 7.3 8.9 9.2  HGB 7.2* 7.7* 7.3* 7.4* 7.0*  HCT 23.4* 24.9* 23.3* 23.8* 22.8*  MCV 91.4 91.5 92.5 93.0 90.8  PLT 122* 126* 130* 165 177    Cardiac Enzymes: No results for input(s): CKTOTAL, CKMB, CKMBINDEX, TROPONINI in the last 168 hours.  BNP (last 3 results) No results for input(s): BNP in the last 8760 hours.  ProBNP (last 3 results) No results for input(s): PROBNP in the last 8760 hours.  Radiological Exams: DG Abd Portable 1V  Result Date: 11/14/2019 CLINICAL DATA:  Ileus EXAM: PORTABLE ABDOMEN - 1 VIEW COMPARISON:  11/12/2019 FINDINGS: Enteric tube with tip in the right upper quadrant consistent with location in the distal stomach. Moderately distended gas-filled colon. No significant gas in the small bowel. Changes likely to represent colonic ileus. No significant change since prior study. IMPRESSION: Enteric tube tip in the right upper quadrant consistent with location in the distal stomach. Persistent colonic ileus. Electronically Signed   By: Burman Nieves M.D.   On: 11/14/2019 06:24    Assessment/Plan Active Problems:  Acute on chronic respiratory failure with hypoxia (HCC)   Chronic atrial fibrillation (HCC)   Seizure disorder (HCC)   Metabolic encephalopathy   COPD, severe (HCC)   1. Acute on chronic respiratory failure with hypoxia  patient has completed 48-hour goal on 28% aerosol trach collar we will continue supportive measures and pulmonary toilet this time. 2. Chronic atrial fibrillation rate is controlled 3. Seizure disorder no active seizures will monitor 4. Metabolic encephalopathy grossly unchanged 5. Severe COPD at baseline we will continue to follow along   I have personally seen and evaluated the patient, evaluated laboratory and imaging results, formulated the assessment and plan and placed orders. The Patient requires high  complexity decision making with multiple systems involvement.  Rounds were done with the Respiratory Therapy Director and Staff therapists and discussed with nursing staff also.  Yevonne Pax, MD [facility] Pulmonary Critical Care Medicine Sleep Medicine

## 2019-11-16 ENCOUNTER — Other Ambulatory Visit (HOSPITAL_COMMUNITY): Payer: BC Managed Care – PPO

## 2019-11-16 DIAGNOSIS — J449 Chronic obstructive pulmonary disease, unspecified: Secondary | ICD-10-CM | POA: Diagnosis not present

## 2019-11-16 DIAGNOSIS — I482 Chronic atrial fibrillation, unspecified: Secondary | ICD-10-CM | POA: Diagnosis not present

## 2019-11-16 DIAGNOSIS — G9341 Metabolic encephalopathy: Secondary | ICD-10-CM | POA: Diagnosis not present

## 2019-11-16 DIAGNOSIS — J9621 Acute and chronic respiratory failure with hypoxia: Secondary | ICD-10-CM | POA: Diagnosis not present

## 2019-11-16 LAB — BASIC METABOLIC PANEL
Anion gap: 6 (ref 5–15)
BUN: 23 mg/dL — ABNORMAL HIGH (ref 6–20)
CO2: 26 mmol/L (ref 22–32)
Calcium: 7.6 mg/dL — ABNORMAL LOW (ref 8.9–10.3)
Chloride: 111 mmol/L (ref 98–111)
Creatinine, Ser: 0.9 mg/dL (ref 0.61–1.24)
GFR calc Af Amer: >60 mL/min (ref >60)
GFR calc non Af Amer: >60 mL/min (ref >60)
Glucose, Bld: 121 mg/dL — ABNORMAL HIGH (ref 70–99)
Potassium: 3.2 mmol/L — ABNORMAL LOW (ref 3.5–5.1)
Sodium: 143 mmol/L (ref 135–145)

## 2019-11-16 LAB — PHOSPHORUS: Phosphorus: 2.3 mg/dL — ABNORMAL LOW (ref 2.5–4.6)

## 2019-11-16 LAB — MAGNESIUM: Magnesium: 2 mg/dL (ref 1.7–2.4)

## 2019-11-16 NOTE — Progress Notes (Addendum)
Pulmonary Critical Care Medicine [facility] [place]   PULMONARY CRITICAL CARE SERVICE  PROGRESS NOTE  Date of Service: 11/16/2019  Connor Roach  [id number]  DOB: 1960/04/17   DOA: 10/30/2019  Referring Physician: Carron Curie, MD  HPI: Connor Roach is a 59 y.o. male seen for follow up of Acute on Chronic Respiratory Failure.  Patient mains on T-bar 28% FiO2 at this time satting well no distress.  Medications: Reviewed on Rounds  Physical Exam:  Vitals: Pulse 83 respirations 12 BP 122/60 O2 sat 96% temp 99.3  Ventilator Settings not currently on ventilator  . General: Comfortable at this time . Eyes: Grossly normal lids, irises & conjunctiva . ENT: grossly tongue is normal . Neck: no obvious mass . Cardiovascular: S1 S2 normal no gallop . Respiratory: No rales or rhonchi noted . Abdomen: soft . Skin: no rash seen on limited exam . Musculoskeletal: not rigid . Psychiatric:unable to assess . Neurologic: no seizure no involuntary movements         Lab Data:   Basic Metabolic Panel: Recent Labs  Lab 11/10/19 1001 11/11/19 1004 11/12/19 1115 11/13/19 0956 11/14/19 1120 11/15/19 1247 11/16/19 0718  NA 143   < > 143 142 142 141 143  K 3.2*   < > 3.6 3.7 3.4* 3.8 3.2*  CL 116*   < > 117* 116* 115* 111 111  CO2 20*   < > 21* 22 23 23 26   GLUCOSE 125*   < > 158* 106* 158* 119* 121*  BUN 29*   < > 29* 30* 27* 26* 23*  CREATININE 1.28*   < > 1.20 1.14 1.03 0.97 0.90  CALCIUM 7.3*   < > 7.3* 7.4* 7.5* 7.6* 7.6*  MG 1.9   < > 1.7 1.7 1.7 1.7 2.0  PHOS 1.9*  --   --  2.4* 2.0* 2.0* 2.3*   < > = values in this interval not displayed.    ABG: Recent Labs  Lab 11/12/19 1530 11/14/19 0856  PHART 7.289* 7.332*  PCO2ART 45.3 43.0  PO2ART 81.3* 80.9*  HCO3 21.1 21.9  O2SAT 96.5 96.0    Liver Function Tests: Recent Labs  Lab 11/12/19 1115 11/13/19 0956 11/14/19 1120 11/15/19 1247  AST 13*  --   --   --   ALT 14  --   --   --   ALKPHOS 36*   --   --   --   BILITOT 0.3  --   --   --   PROT 4.2*  --   --   --   ALBUMIN 1.0* 1.2* 1.1* 1.2*   No results for input(s): LIPASE, AMYLASE in the last 168 hours. Recent Labs  Lab 11/11/19 1004  AMMONIA 60*    CBC: Recent Labs  Lab 11/10/19 1001 11/12/19 1115 11/13/19 0956 11/14/19 1120 11/15/19 1247  WBC 7.6 7.3 8.9 9.2 11.0*  HGB 7.7* 7.3* 7.4* 7.0* 8.5*  HCT 24.9* 23.3* 23.8* 22.8* 26.3*  MCV 91.5 92.5 93.0 90.8 92.0  PLT 126* 130* 165 177 219    Cardiac Enzymes: No results for input(s): CKTOTAL, CKMB, CKMBINDEX, TROPONINI in the last 168 hours.  BNP (last 3 results) No results for input(s): BNP in the last 8760 hours.  ProBNP (last 3 results) No results for input(s): PROBNP in the last 8760 hours.  Radiological Exams: DG Abd Portable 1V  Result Date: 11/16/2019 CLINICAL DATA:  Ileus EXAM: PORTABLE ABDOMEN - 1 VIEW COMPARISON:  Two days ago  FINDINGS: The enteric tube tip is at the distal stomach. Improved gaseous distension of colon. No small bowel distention is seen. No concerning mass effect or gas collection. IMPRESSION: 1. Enteric tube with tip at the distal stomach. 2. Improved, near normalized, colonic distension. Electronically Signed   By: Marnee Spring M.D.   On: 11/16/2019 05:47    Assessment/Plan Active Problems:   Acute on chronic respiratory failure with hypoxia (HCC)   Chronic atrial fibrillation (HCC)   Seizure disorder (HCC)   Metabolic encephalopathy   COPD, severe (HCC)   1. Acute on chronic respiratory failure with hypoxiapatient is now been 3 days on aerosol trach collar 28% currently satting well we will continue to need to monitor patient closely.  Discussed capping trials with RT. 2. Chronic atrial fibrillation rate is controlled 3. Seizure disorder no active seizures will monitor 4. Metabolic encephalopathy grossly unchanged 5. Severe COPD at baseline we will continue to follow along   I have personally seen and evaluated the  patient, evaluated laboratory and imaging results, formulated the assessment and plan and placed orders. The Patient requires high complexity decision making with multiple systems involvement.  Rounds were done with the Respiratory Therapy Director and Staff therapists and discussed with nursing staff also.  Yevonne Pax, MD [facility] Pulmonary Critical Care Medicine Sleep Medicine

## 2019-11-17 ENCOUNTER — Other Ambulatory Visit (HOSPITAL_COMMUNITY): Payer: BC Managed Care – PPO

## 2019-11-17 DIAGNOSIS — G9341 Metabolic encephalopathy: Secondary | ICD-10-CM | POA: Diagnosis not present

## 2019-11-17 DIAGNOSIS — J449 Chronic obstructive pulmonary disease, unspecified: Secondary | ICD-10-CM | POA: Diagnosis not present

## 2019-11-17 DIAGNOSIS — I482 Chronic atrial fibrillation, unspecified: Secondary | ICD-10-CM | POA: Diagnosis not present

## 2019-11-17 DIAGNOSIS — J9621 Acute and chronic respiratory failure with hypoxia: Secondary | ICD-10-CM | POA: Diagnosis not present

## 2019-11-17 LAB — BASIC METABOLIC PANEL
Anion gap: 10 (ref 5–15)
BUN: 22 mg/dL — ABNORMAL HIGH (ref 6–20)
CO2: 23 mmol/L (ref 22–32)
Calcium: 7.5 mg/dL — ABNORMAL LOW (ref 8.9–10.3)
Chloride: 106 mmol/L (ref 98–111)
Creatinine, Ser: 0.91 mg/dL (ref 0.61–1.24)
GFR calc Af Amer: >60 mL/min (ref >60)
GFR calc non Af Amer: >60 mL/min (ref >60)
Glucose, Bld: 133 mg/dL — ABNORMAL HIGH (ref 70–99)
Potassium: 3.7 mmol/L (ref 3.5–5.1)
Sodium: 139 mmol/L (ref 135–145)

## 2019-11-17 LAB — CBC
HCT: 26.9 % — ABNORMAL LOW (ref 39.0–52.0)
Hemoglobin: 8.7 g/dL — ABNORMAL LOW (ref 13.0–17.0)
MCH: 29.8 pg (ref 26.0–34.0)
MCHC: 32.3 g/dL (ref 30.0–36.0)
MCV: 92.1 fL (ref 80.0–100.0)
Platelets: 228 10*3/uL (ref 150–400)
RBC: 2.92 MIL/uL — ABNORMAL LOW (ref 4.22–5.81)
RDW: 15.4 % (ref 11.5–15.5)
WBC: 10.1 10*3/uL (ref 4.0–10.5)
nRBC: 0.3 % — ABNORMAL HIGH (ref 0.0–0.2)

## 2019-11-17 LAB — PHOSPHORUS: Phosphorus: 2.5 mg/dL (ref 2.5–4.6)

## 2019-11-17 LAB — MAGNESIUM: Magnesium: 2 mg/dL (ref 1.7–2.4)

## 2019-11-17 NOTE — Progress Notes (Signed)
Pulmonary Critical Care Medicine [facility] [place]   PULMONARY CRITICAL CARE SERVICE  PROGRESS NOTE  Date of Service: 11/17/2019  Connor Roach  [id number]  DOB: [birth year]   DOA: 10/30/2019  Referring Physician: Carron Curie, MD  HPI: Connor Roach is a 59 y.o. male seen for follow up of Acute on Chronic Respiratory Failure.  Patient this time is on T collar currently on 28% FiO2 good saturations are noted.  Secretions are fair to moderate  Medications: Reviewed on Rounds  Physical Exam:  Vitals: Temperature is 99.2 pulse 82 respiratory rate 20 blood pressure is 98/78 saturations 96%  Ventilator Settings on T collar FiO2 is 28%  . General: Comfortable at this time . Eyes: Grossly normal lids, irises & conjunctiva . ENT: grossly tongue is normal . Neck: no obvious mass . Cardiovascular: S1 S2 normal no gallop . Respiratory: Scattered rhonchi expansion is equal . Abdomen: soft . Skin: no rash seen on limited exam . Musculoskeletal: not rigid . Psychiatric:unable to assess . Neurologic: no seizure no involuntary movements         Lab Data:   Basic Metabolic Panel: Recent Labs  Lab 11/13/19 0956 11/14/19 1120 11/15/19 1247 11/16/19 0718 11/17/19 0601  NA 142 142 141 143 139  K 3.7 3.4* 3.8 3.2* 3.7  CL 116* 115* 111 111 106  CO2 22 23 23 26 23   GLUCOSE 106* 158* 119* 121* 133*  BUN 30* 27* 26* 23* 22*  CREATININE 1.14 1.03 0.97 0.90 0.91  CALCIUM 7.4* 7.5* 7.6* 7.6* 7.5*  MG 1.7 1.7 1.7 2.0 2.0  PHOS 2.4* 2.0* 2.0* 2.3* 2.5    ABG: Recent Labs  Lab 11/12/19 1530 11/14/19 0856  PHART 7.289* 7.332*  PCO2ART 45.3 43.0  PO2ART 81.3* 80.9*  HCO3 21.1 21.9  O2SAT 96.5 96.0    Liver Function Tests: Recent Labs  Lab 11/12/19 1115 11/13/19 0956 11/14/19 1120 11/15/19 1247  AST 13*  --   --   --   ALT 14  --   --   --   ALKPHOS 36*  --   --   --   BILITOT 0.3  --   --   --   PROT 4.2*  --   --   --   ALBUMIN 1.0* 1.2* 1.1* 1.2*    No results for input(s): LIPASE, AMYLASE in the last 168 hours. Recent Labs  Lab 11/11/19 1004  AMMONIA 60*    CBC: Recent Labs  Lab 11/12/19 1115 11/13/19 0956 11/14/19 1120 11/15/19 1247 11/17/19 0601  WBC 7.3 8.9 9.2 11.0* 10.1  HGB 7.3* 7.4* 7.0* 8.5* 8.7*  HCT 23.3* 23.8* 22.8* 26.3* 26.9*  MCV 92.5 93.0 90.8 92.0 92.1  PLT 130* 165 177 219 228    Cardiac Enzymes: No results for input(s): CKTOTAL, CKMB, CKMBINDEX, TROPONINI in the last 168 hours.  BNP (last 3 results) No results for input(s): BNP in the last 8760 hours.  ProBNP (last 3 results) No results for input(s): PROBNP in the last 8760 hours.  Radiological Exams: DG Abd Portable 1V  Result Date: 11/17/2019 CLINICAL DATA:  Ileus EXAM: PORTABLE ABDOMEN - 1 VIEW COMPARISON:  None. FINDINGS: Multiple gas distended loops of bowel again noted, greatest in the right lower quadrant. No radio-opaque calculi or other significant radiographic abnormality are seen. Enteric tube tip and side port project within the stomach. IMPRESSION: Enteric tube tip and side port within the stomach. Electronically Signed   By: 01/17/2020.D.  On: 11/17/2019 06:45   DG Abd Portable 1V  Result Date: 11/16/2019 CLINICAL DATA:  Ileus EXAM: PORTABLE ABDOMEN - 1 VIEW COMPARISON:  Two days ago FINDINGS: The enteric tube tip is at the distal stomach. Improved gaseous distension of colon. No small bowel distention is seen. No concerning mass effect or gas collection. IMPRESSION: 1. Enteric tube with tip at the distal stomach. 2. Improved, near normalized, colonic distension. Electronically Signed   By: Marnee Spring M.D.   On: 11/16/2019 05:47    Assessment/Plan Active Problems:   Acute on chronic respiratory failure with hypoxia (HCC)   Chronic atrial fibrillation (HCC)   Seizure disorder (HCC)   Metabolic encephalopathy   COPD, severe (HCC)   1. Acute on chronic respiratory failure with hypoxia we will continue with T collar  titrate oxygen continue pulmonary toilet. 2. Chronic atrial fibrillation rate is controlled 3. Seizure disorder at baseline we will continue supportive care. 4. Metabolic encephalopathy no change 5. Severe COPD at baseline   I have personally seen and evaluated the patient, evaluated laboratory and imaging results, formulated the assessment and plan and placed orders. The Patient requires high complexity decision making with multiple systems involvement.  Rounds were done with the Respiratory Therapy Director and Staff therapists and discussed with nursing staff also.  Yevonne Pax, MD [facility] Pulmonary Critical Care Medicine Sleep Medicine

## 2019-11-18 DIAGNOSIS — I482 Chronic atrial fibrillation, unspecified: Secondary | ICD-10-CM | POA: Diagnosis not present

## 2019-11-18 DIAGNOSIS — J449 Chronic obstructive pulmonary disease, unspecified: Secondary | ICD-10-CM | POA: Diagnosis not present

## 2019-11-18 DIAGNOSIS — J9621 Acute and chronic respiratory failure with hypoxia: Secondary | ICD-10-CM | POA: Diagnosis not present

## 2019-11-18 DIAGNOSIS — G9341 Metabolic encephalopathy: Secondary | ICD-10-CM | POA: Diagnosis not present

## 2019-11-18 LAB — TRIGLYCERIDES: Triglycerides: 99 mg/dL (ref <150)

## 2019-11-18 NOTE — Progress Notes (Signed)
Pulmonary Critical Care Medicine [facility] [place]   PULMONARY CRITICAL CARE SERVICE  PROGRESS NOTE  Date of Service: 11/18/2019  Connor Roach  OAC:[id number]  DOB: 1960/07/05   DOA: 10/30/2019  Referring Physician: Carron Curie, MD  HPI: Connor Roach is a 59 y.o. male seen for follow up of Acute on Chronic Respiratory Failure.  Patient at this time is on T collar has been on 20% FiO2 with good saturations.  Medications: Reviewed on Rounds  Physical Exam:  Vitals: Temperature is 98.8 pulse 77 respiratory 26 blood pressure is 139/72 saturations 94%  Ventilator Settings off the ventilator on T collar FiO2 20%  . General: Comfortable at this time . Eyes: Grossly normal lids, irises & conjunctiva . ENT: grossly tongue is normal . Neck: no obvious mass . Cardiovascular: S1 S2 normal no gallop . Respiratory: No rhonchi no rales are noted at this time . Abdomen: soft . Skin: no rash seen on limited exam . Musculoskeletal: not rigid . Psychiatric:unable to assess . Neurologic: no seizure no involuntary movements         Lab Data:   Basic Metabolic Panel: Recent Labs  Lab 11/13/19 0956 11/14/19 1120 11/15/19 1247 11/16/19 0718 11/17/19 0601  NA 142 142 141 143 139  K 3.7 3.4* 3.8 3.2* 3.7  CL 116* 115* 111 111 106  CO2 22 23 23 26 23   GLUCOSE 106* 158* 119* 121* 133*  BUN 30* 27* 26* 23* 22*  CREATININE 1.14 1.03 0.97 0.90 0.91  CALCIUM 7.4* 7.5* 7.6* 7.6* 7.5*  MG 1.7 1.7 1.7 2.0 2.0  PHOS 2.4* 2.0* 2.0* 2.3* 2.5    ABG: Recent Labs  Lab 11/12/19 1530 11/14/19 0856  PHART 7.289* 7.332*  PCO2ART 45.3 43.0  PO2ART 81.3* 80.9*  HCO3 21.1 21.9  O2SAT 96.5 96.0    Liver Function Tests: Recent Labs  Lab 11/12/19 1115 11/13/19 0956 11/14/19 1120 11/15/19 1247  AST 13*  --   --   --   ALT 14  --   --   --   ALKPHOS 36*  --   --   --   BILITOT 0.3  --   --   --   PROT 4.2*  --   --   --   ALBUMIN 1.0* 1.2* 1.1* 1.2*   No results for  input(s): LIPASE, AMYLASE in the last 168 hours. No results for input(s): AMMONIA in the last 168 hours.  CBC: Recent Labs  Lab 11/12/19 1115 11/13/19 0956 11/14/19 1120 11/15/19 1247 11/17/19 0601  WBC 7.3 8.9 9.2 11.0* 10.1  HGB 7.3* 7.4* 7.0* 8.5* 8.7*  HCT 23.3* 23.8* 22.8* 26.3* 26.9*  MCV 92.5 93.0 90.8 92.0 92.1  PLT 130* 165 177 219 228    Cardiac Enzymes: No results for input(s): CKTOTAL, CKMB, CKMBINDEX, TROPONINI in the last 168 hours.  BNP (last 3 results) No results for input(s): BNP in the last 8760 hours.  ProBNP (last 3 results) No results for input(s): PROBNP in the last 8760 hours.  Radiological Exams: DG Abd Portable 1V  Result Date: 11/17/2019 CLINICAL DATA:  Ileus EXAM: PORTABLE ABDOMEN - 1 VIEW COMPARISON:  None. FINDINGS: Multiple gas distended loops of bowel again noted, greatest in the right lower quadrant. No radio-opaque calculi or other significant radiographic abnormality are seen. Enteric tube tip and side port project within the stomach. IMPRESSION: Enteric tube tip and side port within the stomach. Electronically Signed   By: 01/17/2020.D.  On: 11/17/2019 06:45    Assessment/Plan Active Problems:   Acute on chronic respiratory failure with hypoxia (HCC)   Chronic atrial fibrillation (HCC)   Seizure disorder (HCC)   Metabolic encephalopathy   COPD, severe (HCC)   1. Acute on chronic respiratory failure with hypoxia plan is to continue with T collar trials patient is doing fine secretions still quite copious. 2. Chronic atrial fibrillation rate is controlled we will continue monitor 3. Seizure disorder no active seizures 4. Metabolic encephalopathy at baseline 5. Severe COPD medical management   I have personally seen and evaluated the patient, evaluated laboratory and imaging results, formulated the assessment and plan and placed orders. The Patient requires high complexity decision making with multiple systems involvement.   Rounds were done with the Respiratory Therapy Director and Staff therapists and discussed with nursing staff also.  Yevonne Pax, MD [facility] Pulmonary Critical Care Medicine Sleep Medicine

## 2019-11-19 ENCOUNTER — Other Ambulatory Visit (HOSPITAL_COMMUNITY): Payer: BC Managed Care – PPO

## 2019-11-19 DIAGNOSIS — I482 Chronic atrial fibrillation, unspecified: Secondary | ICD-10-CM | POA: Diagnosis not present

## 2019-11-19 DIAGNOSIS — J9621 Acute and chronic respiratory failure with hypoxia: Secondary | ICD-10-CM | POA: Diagnosis not present

## 2019-11-19 DIAGNOSIS — J449 Chronic obstructive pulmonary disease, unspecified: Secondary | ICD-10-CM | POA: Diagnosis not present

## 2019-11-19 DIAGNOSIS — G9341 Metabolic encephalopathy: Secondary | ICD-10-CM | POA: Diagnosis not present

## 2019-11-19 LAB — CBC
HCT: 22.6 % — ABNORMAL LOW (ref 39.0–52.0)
Hemoglobin: 7 g/dL — ABNORMAL LOW (ref 13.0–17.0)
MCH: 28.9 pg (ref 26.0–34.0)
MCHC: 31 g/dL (ref 30.0–36.0)
MCV: 93.4 fL (ref 80.0–100.0)
Platelets: 208 10*3/uL (ref 150–400)
RBC: 2.42 MIL/uL — ABNORMAL LOW (ref 4.22–5.81)
RDW: 15.1 % (ref 11.5–15.5)
WBC: 7.4 10*3/uL (ref 4.0–10.5)
nRBC: 0.4 % — ABNORMAL HIGH (ref 0.0–0.2)

## 2019-11-19 LAB — BASIC METABOLIC PANEL
Anion gap: 6 (ref 5–15)
BUN: 23 mg/dL — ABNORMAL HIGH (ref 6–20)
CO2: 29 mmol/L (ref 22–32)
Calcium: 7.8 mg/dL — ABNORMAL LOW (ref 8.9–10.3)
Chloride: 107 mmol/L (ref 98–111)
Creatinine, Ser: 0.88 mg/dL (ref 0.61–1.24)
GFR calc Af Amer: >60 mL/min (ref >60)
GFR calc non Af Amer: >60 mL/min (ref >60)
Glucose, Bld: 112 mg/dL — ABNORMAL HIGH (ref 70–99)
Potassium: 3.4 mmol/L — ABNORMAL LOW (ref 3.5–5.1)
Sodium: 142 mmol/L (ref 135–145)

## 2019-11-19 LAB — MAGNESIUM: Magnesium: 2 mg/dL (ref 1.7–2.4)

## 2019-11-19 LAB — PREPARE RBC (CROSSMATCH)

## 2019-11-19 LAB — OCCULT BLOOD X 1 CARD TO LAB, STOOL: Fecal Occult Bld: NEGATIVE

## 2019-11-19 LAB — PHOSPHORUS: Phosphorus: 2.3 mg/dL — ABNORMAL LOW (ref 2.5–4.6)

## 2019-11-19 NOTE — Progress Notes (Signed)
Pulmonary Critical Care Medicine [facility] [place]   PULMONARY CRITICAL CARE SERVICE  PROGRESS NOTE  Date of Service: 11/19/2019  Connor Roach  [id number]:[id number]  DOB: December 03, 1960   DOA: 10/30/2019  Referring Physician: Carron Curie, MD  HPI: Connor Roach is a 59 y.o. male seen for follow up of Acute on Chronic Respiratory Failure.  Patient remains on T collar currently is on 28% FiO2 copious secretions are noted requiring frequent suctioning  Medications: Reviewed on Rounds  Physical Exam:  Vitals: Temperature is 98.7 pulse 82 respiratory 21 blood pressure is 138/61 saturations 94%  Ventilator Settings on T collar with an FiO2 20%  . General: Comfortable at this time . Eyes: Grossly normal lids, irises & conjunctiva . ENT: grossly tongue is normal . Neck: no obvious mass . Cardiovascular: S1 S2 normal no gallop . Respiratory: No rhonchi no rales are noted . Abdomen: soft . Skin: no rash seen on limited exam . Musculoskeletal: not rigid . Psychiatric:unable to assess . Neurologic: no seizure no involuntary movements         Lab Data:   Basic Metabolic Panel: Recent Labs  Lab 11/14/19 1120 11/15/19 1247 11/16/19 0718 11/17/19 0601 11/19/19 0623  NA 142 141 143 139 142  K 3.4* 3.8 3.2* 3.7 3.4*  CL 115* 111 111 106 107  CO2 23 23 26 23 29   GLUCOSE 158* 119* 121* 133* 112*  BUN 27* 26* 23* 22* 23*  CREATININE 1.03 0.97 0.90 0.91 0.88  CALCIUM 7.5* 7.6* 7.6* 7.5* 7.8*  MG 1.7 1.7 2.0 2.0 2.0  PHOS 2.0* 2.0* 2.3* 2.5 2.3*    ABG: Recent Labs  Lab 11/12/19 1530 11/14/19 0856  PHART 7.289* 7.332*  PCO2ART 45.3 43.0  PO2ART 81.3* 80.9*  HCO3 21.1 21.9  O2SAT 96.5 96.0    Liver Function Tests: Recent Labs  Lab 11/12/19 1115 11/13/19 0956 11/14/19 1120 11/15/19 1247  AST 13*  --   --   --   ALT 14  --   --   --   ALKPHOS 36*  --   --   --   BILITOT 0.3  --   --   --   PROT 4.2*  --   --   --   ALBUMIN 1.0* 1.2* 1.1* 1.2*   No  results for input(s): LIPASE, AMYLASE in the last 168 hours. No results for input(s): AMMONIA in the last 168 hours.  CBC: Recent Labs  Lab 11/13/19 0956 11/14/19 1120 11/15/19 1247 11/17/19 0601 11/19/19 0623  WBC 8.9 9.2 11.0* 10.1 7.4  HGB 7.4* 7.0* 8.5* 8.7* 7.0*  HCT 23.8* 22.8* 26.3* 26.9* 22.6*  MCV 93.0 90.8 92.0 92.1 93.4  PLT 165 177 219 228 208    Cardiac Enzymes: No results for input(s): CKTOTAL, CKMB, CKMBINDEX, TROPONINI in the last 168 hours.  BNP (last 3 results) No results for input(s): BNP in the last 8760 hours.  ProBNP (last 3 results) No results for input(s): PROBNP in the last 8760 hours.  Radiological Exams: DG CHEST PORT 1 VIEW  Result Date: 11/19/2019 CLINICAL DATA:  Pneumonia EXAM: PORTABLE CHEST 1 VIEW COMPARISON:  11/09/2019 FINDINGS: Tracheostomy tube in place. Feeding tube which at least reaches the stomach. Hazy opacity of the bilateral chest at least partially from pleural fluid. There is also increased interstitial and hazy airspace density. Stable heart size. IMPRESSION: Interval symmetric pulmonary opacity with pleural effusions, appearance favoring volume overload. There is history of pneumonia, which may be superimposed. Electronically  Signed   By: Marnee Spring M.D.   On: 11/19/2019 06:47   DG Abd Portable 1V  Result Date: 11/19/2019 CLINICAL DATA:  Ileus. EXAM: PORTABLE ABDOMEN - 1 VIEW COMPARISON:  11/17/2019 FINDINGS: 0605 hours. NG tube tip is in the distal stomach. Mild interval increase in gaseous distention of transverse colon. No substantial small bowel dilatation evident. Visualized bony anatomy unremarkable. IMPRESSION: Mild interval increase in gaseous distention of transverse colon. Electronically Signed   By: Kennith Center M.D.   On: 11/19/2019 06:47    Assessment/Plan Active Problems:   Acute on chronic respiratory failure with hypoxia (HCC)   Chronic atrial fibrillation (HCC)   Seizure disorder (HCC)   Metabolic  encephalopathy   COPD, severe (HCC)   1. On T collar right now on 28% FiO2.  We will continue with aggressive pulmonary toilet secretion management. 2. Chronic atrial fibrillation rate is controlled we will continue to follow 3. Seizure disorder no active seizures noted 4. Metabolic encephalopathy supportive care 5. Severe COPD at baseline   I have personally seen and evaluated the patient, evaluated laboratory and imaging results, formulated the assessment and plan and placed orders. The Patient requires high complexity decision making with multiple systems involvement.  Rounds were done with the Respiratory Therapy Director and Staff therapists and discussed with nursing staff also.  Yevonne Pax, MD [facility] Pulmonary Critical Care Medicine Sleep Medicine

## 2019-11-20 DIAGNOSIS — I482 Chronic atrial fibrillation, unspecified: Secondary | ICD-10-CM | POA: Diagnosis not present

## 2019-11-20 DIAGNOSIS — J449 Chronic obstructive pulmonary disease, unspecified: Secondary | ICD-10-CM | POA: Diagnosis not present

## 2019-11-20 DIAGNOSIS — G9341 Metabolic encephalopathy: Secondary | ICD-10-CM | POA: Diagnosis not present

## 2019-11-20 DIAGNOSIS — J9621 Acute and chronic respiratory failure with hypoxia: Secondary | ICD-10-CM | POA: Diagnosis not present

## 2019-11-20 LAB — URINALYSIS, ROUTINE W REFLEX MICROSCOPIC
Bilirubin Urine: NEGATIVE
Glucose, UA: NEGATIVE mg/dL
Ketones, ur: NEGATIVE mg/dL
Leukocytes,Ua: NEGATIVE
Nitrite: NEGATIVE
Protein, ur: 30 mg/dL — AB
Specific Gravity, Urine: 1.012 (ref 1.005–1.030)
pH: 6 (ref 5.0–8.0)

## 2019-11-20 LAB — EXPECTORATED SPUTUM ASSESSMENT W GRAM STAIN, RFLX TO RESP C: Special Requests: NORMAL

## 2019-11-20 LAB — BASIC METABOLIC PANEL
Anion gap: 9 (ref 5–15)
BUN: 23 mg/dL — ABNORMAL HIGH (ref 6–20)
CO2: 32 mmol/L (ref 22–32)
Calcium: 7.6 mg/dL — ABNORMAL LOW (ref 8.9–10.3)
Chloride: 101 mmol/L (ref 98–111)
Creatinine, Ser: 0.92 mg/dL (ref 0.61–1.24)
GFR calc Af Amer: >60 mL/min (ref >60)
GFR calc non Af Amer: >60 mL/min (ref >60)
Glucose, Bld: 122 mg/dL — ABNORMAL HIGH (ref 70–99)
Potassium: 3.2 mmol/L — ABNORMAL LOW (ref 3.5–5.1)
Sodium: 142 mmol/L (ref 135–145)

## 2019-11-20 LAB — AMMONIA: Ammonia: 76 umol/L — ABNORMAL HIGH (ref 9–35)

## 2019-11-20 NOTE — Progress Notes (Addendum)
Pulmonary Critical Care Medicine [facility] [place]   PULMONARY CRITICAL CARE SERVICE  PROGRESS NOTE  Date of Service: 11/20/2019  Connor Roach  [id number]  DOB: 12-Sep-1960   DOA: 10/30/2019  Referring Physician: Carron Curie, MD  HPI: Connor Roach is a 59 y.o. male seen for follow up of Acute on Chronic Respiratory Failure.  Patient remains on aerosol trach collar 20% FiO2.  Did have a fever noted of 101 sputum cultures been sent.  Medications: Reviewed on Rounds  Physical Exam:  Vitals: Pulse 86 respirations 20 BP 151/83 O2 sat 97% temp 101.6  Ventilator Settings ATC 28%  . General: Comfortable at this time . Eyes: Grossly normal lids, irises & conjunctiva . ENT: grossly tongue is normal . Neck: no obvious mass . Cardiovascular: S1 S2 normal no gallop . Respiratory: No rales or rhonchi noted . Abdomen: soft . Skin: no rash seen on limited exam . Musculoskeletal: not rigid . Psychiatric:unable to assess . Neurologic: no seizure no involuntary movements         Lab Data:   Basic Metabolic Panel: Recent Labs  Lab 11/14/19 1120 11/14/19 1120 11/15/19 1247 11/16/19 0718 11/17/19 0601 11/19/19 0623 11/20/19 0641  NA 142   < > 141 143 139 142 142  K 3.4*   < > 3.8 3.2* 3.7 3.4* 3.2*  CL 115*   < > 111 111 106 107 101  CO2 23   < > 23 26 23 29  32  GLUCOSE 158*   < > 119* 121* 133* 112* 122*  BUN 27*   < > 26* 23* 22* 23* 23*  CREATININE 1.03   < > 0.97 0.90 0.91 0.88 0.92  CALCIUM 7.5*   < > 7.6* 7.6* 7.5* 7.8* 7.6*  MG 1.7  --  1.7 2.0 2.0 2.0  --   PHOS 2.0*  --  2.0* 2.3* 2.5 2.3*  --    < > = values in this interval not displayed.    ABG: Recent Labs  Lab 11/14/19 0856  PHART 7.332*  PCO2ART 43.0  PO2ART 80.9*  HCO3 21.9  O2SAT 96.0    Liver Function Tests: Recent Labs  Lab 11/14/19 1120 11/15/19 1247  ALBUMIN 1.1* 1.2*   No results for input(s): LIPASE, AMYLASE in the last 168 hours. Recent Labs  Lab 11/20/19 0641   AMMONIA 76*    CBC: Recent Labs  Lab 11/14/19 1120 11/15/19 1247 11/17/19 0601 11/19/19 0623  WBC 9.2 11.0* 10.1 7.4  HGB 7.0* 8.5* 8.7* 7.0*  HCT 22.8* 26.3* 26.9* 22.6*  MCV 90.8 92.0 92.1 93.4  PLT 177 219 228 208    Cardiac Enzymes: No results for input(s): CKTOTAL, CKMB, CKMBINDEX, TROPONINI in the last 168 hours.  BNP (last 3 results) No results for input(s): BNP in the last 8760 hours.  ProBNP (last 3 results) No results for input(s): PROBNP in the last 8760 hours.  Radiological Exams: DG CHEST PORT 1 VIEW  Result Date: 11/19/2019 CLINICAL DATA:  Pneumonia EXAM: PORTABLE CHEST 1 VIEW COMPARISON:  11/09/2019 FINDINGS: Tracheostomy tube in place. Feeding tube which at least reaches the stomach. Hazy opacity of the bilateral chest at least partially from pleural fluid. There is also increased interstitial and hazy airspace density. Stable heart size. IMPRESSION: Interval symmetric pulmonary opacity with pleural effusions, appearance favoring volume overload. There is history of pneumonia, which may be superimposed. Electronically Signed   By: 11/11/2019 M.D.   On: 11/19/2019 06:47   DG Abd  Portable 1V  Result Date: 11/19/2019 CLINICAL DATA:  Ileus. EXAM: PORTABLE ABDOMEN - 1 VIEW COMPARISON:  11/17/2019 FINDINGS: 0605 hours. NG tube tip is in the distal stomach. Mild interval increase in gaseous distention of transverse colon. No substantial small bowel dilatation evident. Visualized bony anatomy unremarkable. IMPRESSION: Mild interval increase in gaseous distention of transverse colon. Electronically Signed   By: Kennith Center M.D.   On: 11/19/2019 06:47    Assessment/Plan Active Problems:   Acute on chronic respiratory failure with hypoxia (HCC)   Chronic atrial fibrillation (HCC)   Seizure disorder (HCC)   Metabolic encephalopathy   COPD, severe (HCC)   1. Acute on chronic respiratory failure with hypoxia patient will continue on aerosol trach collar 20% at  this time sputum culture has been sent to locate source of fever.  Continue supportive measures and pulmonary toilet. 2. Chronic atrial fibrillation rate is controlled we will continue to follow 3. Seizure disorder no active seizures noted 4. Metabolic encephalopathy supportive care 5. Severe COPD at baseline   I have personally seen and evaluated the patient, evaluated laboratory and imaging results, formulated the assessment and plan and placed orders. The Patient requires high complexity decision making with multiple systems involvement.  Rounds were done with the Respiratory Therapy Director and Staff therapists and discussed with nursing staff also.  Yevonne Pax, MD [facility] Pulmonary Critical Care Medicine Sleep Medicine

## 2019-11-21 ENCOUNTER — Other Ambulatory Visit (HOSPITAL_COMMUNITY): Payer: BC Managed Care – PPO

## 2019-11-21 DIAGNOSIS — J449 Chronic obstructive pulmonary disease, unspecified: Secondary | ICD-10-CM | POA: Diagnosis not present

## 2019-11-21 DIAGNOSIS — J9621 Acute and chronic respiratory failure with hypoxia: Secondary | ICD-10-CM | POA: Diagnosis not present

## 2019-11-21 DIAGNOSIS — G9341 Metabolic encephalopathy: Secondary | ICD-10-CM | POA: Diagnosis not present

## 2019-11-21 DIAGNOSIS — I482 Chronic atrial fibrillation, unspecified: Secondary | ICD-10-CM | POA: Diagnosis not present

## 2019-11-21 LAB — RENAL FUNCTION PANEL
Albumin: 1.4 g/dL — ABNORMAL LOW (ref 3.5–5.0)
Anion gap: 9 (ref 5–15)
BUN: 23 mg/dL — ABNORMAL HIGH (ref 6–20)
CO2: 34 mmol/L — ABNORMAL HIGH (ref 22–32)
Calcium: 7.5 mg/dL — ABNORMAL LOW (ref 8.9–10.3)
Chloride: 97 mmol/L — ABNORMAL LOW (ref 98–111)
Creatinine, Ser: 0.82 mg/dL (ref 0.61–1.24)
GFR calc non Af Amer: >60 mL/min (ref >60)
Glucose, Bld: 117 mg/dL — ABNORMAL HIGH (ref 70–99)
Phosphorus: 2.1 mg/dL — ABNORMAL LOW (ref 2.5–4.6)
Potassium: 3 mmol/L — ABNORMAL LOW (ref 3.5–5.1)
Sodium: 140 mmol/L (ref 135–145)

## 2019-11-21 LAB — BPAM RBC
Blood Product Expiration Date: 202110292359
ISSUE DATE / TIME: 202110051411
Unit Type and Rh: 7300

## 2019-11-21 LAB — TYPE AND SCREEN
ABO/RH(D): B POS
Antibody Screen: NEGATIVE
Unit division: 0

## 2019-11-21 LAB — CBC
HCT: 30.4 % — ABNORMAL LOW (ref 39.0–52.0)
Hemoglobin: 9.7 g/dL — ABNORMAL LOW (ref 13.0–17.0)
MCH: 28.9 pg (ref 26.0–34.0)
MCHC: 31.9 g/dL (ref 30.0–36.0)
MCV: 90.5 fL (ref 80.0–100.0)
Platelets: 191 10*3/uL (ref 150–400)
RBC: 3.36 MIL/uL — ABNORMAL LOW (ref 4.22–5.81)
RDW: 14.2 % (ref 11.5–15.5)
WBC: 7.9 10*3/uL (ref 4.0–10.5)
nRBC: 0.3 % — ABNORMAL HIGH (ref 0.0–0.2)

## 2019-11-21 LAB — URINE CULTURE
Culture: NO GROWTH
Special Requests: NORMAL

## 2019-11-21 LAB — LACTIC ACID, PLASMA: Lactic Acid, Venous: 1.5 mmol/L (ref 0.5–1.9)

## 2019-11-21 NOTE — Progress Notes (Addendum)
Pulmonary Critical Care Medicine [facility] [place]   PULMONARY CRITICAL CARE SERVICE  PROGRESS NOTE  Date of Service: 11/21/2019  Connor Roach  [id number]  DOB: [birth year]   DOA: 10/30/2019  Referring Physician: Carron Curie, MD  HPI: Connor Roach is a 59 y.o. male seen for follow up of Acute on Chronic Respiratory Failure.  Patient remains on 20% aerosol trach collar satting well with moderate secretions noted.  Medications: Reviewed on Rounds  Physical Exam:  Vitals: Pulse 76 respirations 16 BP 152/66 O2 sat 98% temp 98.7  Ventilator Settings ATC 28%  . General: Comfortable at this time . Eyes: Grossly normal lids, irises & conjunctiva . ENT: grossly tongue is normal . Neck: no obvious mass . Cardiovascular: S1 S2 normal no gallop . Respiratory: Coarse breath sounds . Abdomen: soft . Skin: no rash seen on limited exam . Musculoskeletal: not rigid . Psychiatric:unable to assess . Neurologic: no seizure no involuntary movements         Lab Data:   Basic Metabolic Panel: Recent Labs  Lab 11/14/19 1120 11/14/19 1120 11/15/19 1247 11/16/19 0718 11/17/19 0601 11/19/19 0623 11/20/19 0641  NA 142   < > 141 143 139 142 142  K 3.4*   < > 3.8 3.2* 3.7 3.4* 3.2*  CL 115*   < > 111 111 106 107 101  CO2 23   < > 23 26 23 29  32  GLUCOSE 158*   < > 119* 121* 133* 112* 122*  BUN 27*   < > 26* 23* 22* 23* 23*  CREATININE 1.03   < > 0.97 0.90 0.91 0.88 0.92  CALCIUM 7.5*   < > 7.6* 7.6* 7.5* 7.8* 7.6*  MG 1.7  --  1.7 2.0 2.0 2.0  --   PHOS 2.0*  --  2.0* 2.3* 2.5 2.3*  --    < > = values in this interval not displayed.    ABG: No results for input(s): PHART, PCO2ART, PO2ART, HCO3, O2SAT in the last 168 hours.  Liver Function Tests: Recent Labs  Lab 11/14/19 1120 11/15/19 1247  ALBUMIN 1.1* 1.2*   No results for input(s): LIPASE, AMYLASE in the last 168 hours. Recent Labs  Lab 11/20/19 0641  AMMONIA 76*    CBC: Recent Labs  Lab  11/14/19 1120 11/15/19 1247 11/17/19 0601 11/19/19 0623  WBC 9.2 11.0* 10.1 7.4  HGB 7.0* 8.5* 8.7* 7.0*  HCT 22.8* 26.3* 26.9* 22.6*  MCV 90.8 92.0 92.1 93.4  PLT 177 219 228 208    Cardiac Enzymes: No results for input(s): CKTOTAL, CKMB, CKMBINDEX, TROPONINI in the last 168 hours.  BNP (last 3 results) No results for input(s): BNP in the last 8760 hours.  ProBNP (last 3 results) No results for input(s): PROBNP in the last 8760 hours.  Radiological Exams: DG Abd 1 View  Result Date: 11/21/2019 CLINICAL DATA:  Ileus EXAM: ABDOMEN - 1 VIEW COMPARISON:  11/19/2019 FINDINGS: NG tube tip is in the distal stomach, in the region of the pylorus. No gaseous small bowel dilatation. Diffuse gaseous distention of right and transverse colon is similar to prior. Left colon appears relatively decompressed. Visualized bony anatomy unremarkable. IMPRESSION: 1. NG tube tip is in the distal stomach. 2. Stable diffuse gaseous distention of the right and transverse colon. Electronically Signed   By: 01/19/2020 M.D.   On: 11/21/2019 06:38   DG CHEST PORT 1 VIEW  Result Date: 11/21/2019 CLINICAL DATA:  Pleural effusion. EXAM: PORTABLE CHEST  1 VIEW COMPARISON:  11/19/2019 FINDINGS: 0619 hours. Tracheostomy tube again noted. The NG tube passes into the stomach although the distal tip position is not included on the film. Cardiopericardial silhouette is at upper limits of normal for size. Diffuse interstitial opacity is similar to prior with basilar atelectasis and layering bilateral pleural effusions, right greater than left. The visualized bony structures of the thorax show no acute abnormality. Telemetry leads overlie the chest. IMPRESSION: No substantial change. Pulmonary edema pattern with layering bilateral pleural effusions, right greater than left. Electronically Signed   By: Kennith Center M.D.   On: 11/21/2019 06:39    Assessment/Plan Active Problems:   Acute on chronic respiratory failure with  hypoxia (HCC)   Chronic atrial fibrillation (HCC)   Seizure disorder (HCC)   Metabolic encephalopathy   COPD, severe (HCC)   1. Acute on chronic respiratory failure with hypoxia patient will continue on 28% aerosol trach collar continue to wean as tolerated.  Continue secretion management and pulmonary toilet. 2. Chronic atrial fibrillation rate is controlled we will continue to follow 3. Seizure disorder no active seizures noted 4. Metabolic encephalopathy supportive care 5. Severe COPD at baseline    I have personally seen and evaluated the patient, evaluated laboratory and imaging results, formulated the assessment and plan and placed orders. The Patient requires high complexity decision making with multiple systems involvement.  Rounds were done with the Respiratory Therapy Director and Staff therapists and discussed with nursing staff also.  Yevonne Pax, MD [facility] Pulmonary Critical Care Medicine Sleep Medicine

## 2019-11-22 ENCOUNTER — Inpatient Hospital Stay (HOSPITAL_COMMUNITY)
Admission: RE | Admit: 2019-11-22 | Discharge: 2019-11-22 | Disposition: A | Discharge status: Home / Self Care | Payer: BC Managed Care – PPO | Source: Ambulatory Visit | Attending: Internal Medicine | Admitting: Internal Medicine

## 2019-11-22 ENCOUNTER — Other Ambulatory Visit (HOSPITAL_COMMUNITY): Payer: BC Managed Care – PPO

## 2019-11-22 DIAGNOSIS — J449 Chronic obstructive pulmonary disease, unspecified: Secondary | ICD-10-CM | POA: Diagnosis not present

## 2019-11-22 DIAGNOSIS — J9621 Acute and chronic respiratory failure with hypoxia: Secondary | ICD-10-CM | POA: Diagnosis not present

## 2019-11-22 DIAGNOSIS — I482 Chronic atrial fibrillation, unspecified: Secondary | ICD-10-CM | POA: Diagnosis not present

## 2019-11-22 DIAGNOSIS — G9341 Metabolic encephalopathy: Secondary | ICD-10-CM | POA: Diagnosis not present

## 2019-11-22 LAB — RENAL FUNCTION PANEL
Albumin: 1.4 g/dL — ABNORMAL LOW (ref 3.5–5.0)
Anion gap: 9 (ref 5–15)
BUN: 24 mg/dL — ABNORMAL HIGH (ref 6–20)
CO2: 36 mmol/L — ABNORMAL HIGH (ref 22–32)
Calcium: 7.9 mg/dL — ABNORMAL LOW (ref 8.9–10.3)
Chloride: 97 mmol/L — ABNORMAL LOW (ref 98–111)
Creatinine, Ser: 0.79 mg/dL (ref 0.61–1.24)
GFR calc non Af Amer: >60 mL/min (ref >60)
Glucose, Bld: 134 mg/dL — ABNORMAL HIGH (ref 70–99)
Phosphorus: 2.6 mg/dL (ref 2.5–4.6)
Potassium: 2.9 mmol/L — ABNORMAL LOW (ref 3.5–5.1)
Sodium: 142 mmol/L (ref 135–145)

## 2019-11-22 LAB — BLOOD CULTURE ID PANEL (REFLEXED) - BCID2

## 2019-11-22 LAB — MAGNESIUM: Magnesium: 1.6 mg/dL — ABNORMAL LOW (ref 1.7–2.4)

## 2019-11-22 NOTE — Progress Notes (Signed)
Pulmonary Critical Care Medicine [facility] [place]   PULMONARY CRITICAL CARE SERVICE  PROGRESS NOTE  Date of Service: 11/22/2019  Connor Roach  [id number]  DOB: Apr 26, 1960   DOA: 10/30/2019  Referring Physician: Carron Curie, MD  HPI: Connor Roach is a 59 y.o. male seen for follow up of Acute on Chronic Respiratory Failure.  Patient continues to have copious secretions on T collar currently has been on 28% FiO2  Medications: Reviewed on Rounds  Physical Exam:  Vitals: Temperature 97.9 pulse 69 respiratory rate is 20 blood pressure is 146/74 saturations 94%  Ventilator Settings on T collar with an FiO2 28%  . General: Comfortable at this time . Eyes: Grossly normal lids, irises & conjunctiva . ENT: grossly tongue is normal . Neck: no obvious mass . Cardiovascular: S1 S2 normal no gallop . Respiratory: Scattered rhonchi coarse breath sounds . Abdomen: soft . Skin: no rash seen on limited exam . Musculoskeletal: not rigid . Psychiatric:unable to assess . Neurologic: no seizure no involuntary movements         Lab Data:   Basic Metabolic Panel: Recent Labs  Lab 11/15/19 1247 11/15/19 1247 11/16/19 0718 11/16/19 0718 11/17/19 0601 11/19/19 0623 11/20/19 0641 11/21/19 1113 11/22/19 0533  NA 141   < > 143   < > 139 142 142 140 142  K 3.8   < > 3.2*   < > 3.7 3.4* 3.2* 3.0* 2.9*  CL 111   < > 111   < > 106 107 101 97* 97*  CO2 23   < > 26   < > 23 29 32 34* 36*  GLUCOSE 119*   < > 121*   < > 133* 112* 122* 117* 134*  BUN 26*   < > 23*   < > 22* 23* 23* 23* 24*  CREATININE 0.97   < > 0.90   < > 0.91 0.88 0.92 0.82 0.79  CALCIUM 7.6*   < > 7.6*   < > 7.5* 7.8* 7.6* 7.5* 7.9*  MG 1.7  --  2.0  --  2.0 2.0  --   --  1.6*  PHOS 2.0*   < > 2.3*  --  2.5 2.3*  --  2.1* 2.6   < > = values in this interval not displayed.    ABG: No results for input(s): PHART, PCO2ART, PO2ART, HCO3, O2SAT in the last 168 hours.  Liver Function Tests: Recent Labs   Lab 11/15/19 1247 11/21/19 1113 11/22/19 0533  ALBUMIN 1.2* 1.4* 1.4*   No results for input(s): LIPASE, AMYLASE in the last 168 hours. Recent Labs  Lab 11/20/19 0641  AMMONIA 76*    CBC: Recent Labs  Lab 11/15/19 1247 11/17/19 0601 11/19/19 0623 11/21/19 1113  WBC 11.0* 10.1 7.4 7.9  HGB 8.5* 8.7* 7.0* 9.7*  HCT 26.3* 26.9* 22.6* 30.4*  MCV 92.0 92.1 93.4 90.5  PLT 219 228 208 191    Cardiac Enzymes: No results for input(s): CKTOTAL, CKMB, CKMBINDEX, TROPONINI in the last 168 hours.  BNP (last 3 results) No results for input(s): BNP in the last 8760 hours.  ProBNP (last 3 results) No results for input(s): PROBNP in the last 8760 hours.  Radiological Exams: DG Abd 1 View  Result Date: 11/21/2019 CLINICAL DATA:  Ileus EXAM: ABDOMEN - 1 VIEW COMPARISON:  11/19/2019 FINDINGS: NG tube tip is in the distal stomach, in the region of the pylorus. No gaseous small bowel dilatation. Diffuse gaseous distention of right  and transverse colon is similar to prior. Left colon appears relatively decompressed. Visualized bony anatomy unremarkable. IMPRESSION: 1. NG tube tip is in the distal stomach. 2. Stable diffuse gaseous distention of the right and transverse colon. Electronically Signed   By: Kennith Center M.D.   On: 11/21/2019 06:38   DG CHEST PORT 1 VIEW  Result Date: 11/21/2019 CLINICAL DATA:  Pleural effusion. EXAM: PORTABLE CHEST 1 VIEW COMPARISON:  11/19/2019 FINDINGS: 0619 hours. Tracheostomy tube again noted. The NG tube passes into the stomach although the distal tip position is not included on the film. Cardiopericardial silhouette is at upper limits of normal for size. Diffuse interstitial opacity is similar to prior with basilar atelectasis and layering bilateral pleural effusions, right greater than left. The visualized bony structures of the thorax show no acute abnormality. Telemetry leads overlie the chest. IMPRESSION: No substantial change. Pulmonary edema pattern  with layering bilateral pleural effusions, right greater than left. Electronically Signed   By: Kennith Center M.D.   On: 11/21/2019 06:39    Assessment/Plan Active Problems:   Acute on chronic respiratory failure with hypoxia (HCC)   Chronic atrial fibrillation (HCC)   Seizure disorder (HCC)   Metabolic encephalopathy   COPD, severe (HCC)   1. Acute on chronic respiratory failure with hypoxia continue with T collar trials currently on 28% FiO2 secretions are still copious suggested changing the trach out to a cuffless trach 2. Chronic atrial fibrillation rate is controlled we will continue with supportive care 3. Seizure disorder no active seizure noted. 4. Metabolic encephalopathy at baseline 5. Severe COPD medical management we will continue to follow   I have personally seen and evaluated the patient, evaluated laboratory and imaging results, formulated the assessment and plan and placed orders. The Patient requires high complexity decision making with multiple systems involvement.  Rounds were done with the Respiratory Therapy Director and Staff therapists and discussed with nursing staff also.  Yevonne Pax, MD [facility] Pulmonary Critical Care Medicine Sleep Medicine

## 2019-11-22 NOTE — Progress Notes (Signed)
EEG completed, results pending. 

## 2019-11-22 NOTE — Progress Notes (Addendum)
PROGRESS NOTE    CASIMIRO LIENHARD  NWG:[id number] DOB: [birth year] DOA: 10/30/2019  Brief Narrative:  Connor Roach is an 59 y.o. male who was [facility].  He has a medical history significant of bipolar disorder, polysubstance abuse.  He was initially admitted to the acute hospital on 10/13/2019 due to encephalopathy secondary to medication toxicity.  He was found to have acute respiratory failure with hypercapnia.  He was initially unresponsive and had to be intubated.  Patient at that time also had elevated WBC count of 26.9 and creatinine of 1.52.  Urine toxicology screen was positive for cocaine and benzodiazepines.  He was treated for possible sepsis, respiratory failure.  Per records from the outside facility he developed angioedema which was treated and he also had paroxysmal atrial fibrillation and was treated.  Patient's hospital course complicated by seizures and developed on 10/16/2019 likely secondary to withdrawal.  He was placed on seizure medications.  He had multiple electrolyte abnormalities that were corrected.  He had tracheostomy as well as PEG tube placement.  After admission here he was found to have fever and leukocytosis.  He was started on empiric IV vancomycin, cefepime. Patient also was found to have leakage through his PEG tube?  CT of the abdomen and pelvis without contrast was done which showed that the gastrostomy tube pulled back into the abdominal wall.  No bowel obstruction.  Bibasilar atelectasis was noted.  Patient noted to have fever again with T-max 102.  Blood cultures from 11/20/2019 did not show any growth.  However, sputum cultures from 11/20/2019 showed methicillin-resistant staph aureus.  Few stenotrophomonas.  He is started back on vancomycin.  Cefepime was also added due to fevers.  Patient in the interim also developed ileus. He has a trach in place.  He is encephalopathic.  There is concern for seizures.  He is getting EEG at this  time.  Assessment & Plan: Acute hypoxemic/hypercapnic respiratory failure, ventilator dependent  Pneumonia with MRSA Systemic inflammatory response syndrome Fever/leukocytosis PEG tube site infection Atrial fibrillation Encephalopathy Seizure Dysphagia Polysubstance abuse Sacrococcygeal deep tissue pressure injury/left ear pressure ulcer unspecified stage Hypokalemia  Acute hypoxemic/hypercapnic respiratory failure: He initially was found unresponsive probably from polysubstance abuse/intoxication and had to be intubated for respiratory protection. Currently has a trach in place.  Pulmonary following. He is high risk for aspiration and aspiration pneumonia.  Respiratory cultures showed MRSA, mild growth of stenotrophomonas.  Final results still pending at this time.  He has been started on empiric IV vancomycin, cefepime for fevers.   Given concern for seizure would recommend to discontinue cefepime.  Continue IV vancomycin to complete treatment for 7 to 10 days pending improvement.  Please monitor BUN/trending closely while on the antibiotics.  MRSA pneumonia: On IV vancomycin.  Would recommend reevaluation in 7-10 days pending improvement.  Systemic inflammatory response syndrome: Patient having fever, leukocytosis.  He received empiric antibiotic treatment with IV vancomycin, cefepime, Flagyl for anaerobic coverage.    However, after the antibiotics stopped he started having fevers again.  In the interim he had PEG tube site infection.  CT imaging showed PEG tube was displaced into the abdominal wall.  PEG tube removed.  He has some dark/black-colored material draining from around his PEG tube.  On IV vancomycin, cefepime.  Would recommend to discontinue cefepime due to concern for seizures.  If he starts having fevers would recommend to add ceftriaxone, Flagyl for anaerobic coverage for the PEG tube site infection. He also  had acute renal failure at the outside hospital likely secondary to  sepsis.  Please monitor BUN/creatinine closely while on antibiotics.  Fever/leukocytosis: He has been restarted on IV vancomycin, cefepime.  Respiratory culture showing MRSA, mild stenotrophomonas.  Final results still pending at this time.  Recommend to continue treatment with IV vancomycin.  Would recommend to discontinue the cefepime given concern for seizures.  If he starts having any fevers would recommend to add ceftriaxone, Flagyl for gram-negative and anaerobic coverage for the PEG tube site infection.  Blood cultures did not show any growth. Continue to monitor closely.  PEG tube site infection: Antibiotics as mentioned above.  PEG tube has been removed.  Continue local wound care.  Atrial fibrillation: Continue medication and management per the primary team.  Seizure: There is a mention of seizures at the outside facility could be possibly secondary to withdrawal. However, antibiotics lower the seizure threshold.  Patient getting EEG.   As mentioned above, would recommend to discontinue cefepime.  Continue IV vancomycin.  If he starts having fevers would recommend to add ceftriaxone, Flagyl.  Continue medication for seizures per the primary team.  Encephalopathy: Continue supportive management per the primary team.  Dysphagia: Due to his dysphagia he is high risk for aspiration and aspiration pneumonia.  Polysubstance abuse: He is at risk for bacteremia/endocarditis due to his drug abuse.  Antibiotics as mentioned above.  Blood cultures do not show any growth. Continue supportive management per the primary team.  Sacrococcygeal deep tissue pressure injury/left ear pressure ulcer unspecified stage: Continue local wound care.  Electrolyte abnormalities with hypokalemia: Continue to monitor, replacement per the primary team.  Due to his complex medical problems he is high risk for worsening and decompensation. Plan of care discussed with the primary team and pharmacy.    Subjective: He continues to remain encephalopathic.  He is getting EEG.  He is still having fevers with T-max 102.  Objective: Vitals: Temperature 99.1, T-max 102, pulse 76, respiratory rate 20, blood pressure 118/77, pulse oximetry 98%  Examination: General exam: Ill-appearing male, opening eyes but not following commands at this time. Remains encephalopathic. Eyes: PERLA, EOMI  ENMT: external ears and nose appear normal, poor dentition Neck: Has trach in place CVS: S1-S2 Respiratory: Rhonchi, no wheezing Abdomen: Soft, mild distention, diminished bowel sounds, PEG tube removed, site with dressing Musculoskeletal: edema Neuro: He is not following commands.  Unable to do neurologic exam at this time. Psych: Unable to assess Skin: no rashes    Data Reviewed: I have personally reviewed following labs and imaging studies  CBC: Recent Labs  Lab 11/17/19 0601 11/19/19 0623 11/21/19 1113  WBC 10.1 7.4 7.9  HGB 8.7* 7.0* 9.7*  HCT 26.9* 22.6* 30.4*  MCV 92.1 93.4 90.5  PLT 228 208 191    Basic Metabolic Panel: Recent Labs  Lab 11/16/19 0718 11/16/19 0718 11/17/19 0601 11/19/19 0623 11/20/19 0641 11/21/19 1113 11/22/19 0533  NA 143   < > 139 142 142 140 142  K 3.2*   < > 3.7 3.4* 3.2* 3.0* 2.9*  CL 111   < > 106 107 101 97* 97*  CO2 26   < > 23 29 32 34* 36*  GLUCOSE 121*   < > 133* 112* 122* 117* 134*  BUN 23*   < > 22* 23* 23* 23* 24*  CREATININE 0.90   < > 0.91 0.88 0.92 0.82 0.79  CALCIUM 7.6*   < > 7.5* 7.8* 7.6* 7.5* 7.9*  MG 2.0  --  2.0 2.0  --   --  1.6*  PHOS 2.3*  --  2.5 2.3*  --  2.1* 2.6   < > = values in this interval not displayed.    GFR: CrCl cannot be calculated (Unknown ideal weight.).  Liver Function Tests: Recent Labs  Lab 11/21/19 1113 11/22/19 0533  ALBUMIN 1.4* 1.4*    CBG: No results for input(s): GLUCAP in the last 168 hours.   Recent Results (from the past 240 hour(s))  Culture, Urine     Status: None   Collection  Time: 11/20/19  8:39 AM   Specimen: Urine, Catheterized  Result Value Ref Range Status   Specimen Description URINE, CATHETERIZED  Final   Special Requests Normal  Final   Culture   Final    NO GROWTH Performed at [facility], [address] [place], [place] [address]    Report Status 11/21/2019 FINAL  Final  Expectorated sputum assessment w rflx to resp cult     Status: None   Collection Time: 11/20/19  8:45 AM   Specimen: Expectorated Sputum  Result Value Ref Range Status   Specimen Description Expect. Sput  Final   Special Requests Normal  Final   Sputum evaluation   Final    THIS SPECIMEN IS ACCEPTABLE FOR SPUTUM CULTURE Performed at [facility], [address] [place], [place] [address]    Report Status 11/20/2019 FINAL  Final  Culture, respiratory     Status: None (Preliminary result)   Collection Time: 11/20/19  8:45 AM  Result Value Ref Range Status   Specimen Description Expect. Sput  Final   Special Requests Normal Reflexed from [id number]  Final   Gram Stain   Final    ABUNDANT WBC PRESENT, PREDOMINANTLY PMN ABUNDANT GRAM POSITIVE COCCI ABUNDANT GRAM NEGATIVE RODS    Culture   Final    ABUNDANT METHICILLIN RESISTANT STAPHYLOCOCCUS AUREUS FEW STENOTROPHOMONAS MALTOPHILIA SUSCEPTIBILITIES TO FOLLOW Performed at [facility], [address] [place], [place] [address]    Report Status PENDING  Incomplete   Organism ID, Bacteria METHICILLIN RESISTANT STAPHYLOCOCCUS AUREUS  Final      Susceptibility   Methicillin resistant staphylococcus aureus - MIC*    CIPROFLOXACIN >=8 RESISTANT Resistant     ERYTHROMYCIN >=8 RESISTANT Resistant     GENTAMICIN >=16 RESISTANT Resistant     OXACILLIN >=4 RESISTANT Resistant     TETRACYCLINE <=1 SENSITIVE Sensitive     VANCOMYCIN <=0.5 SENSITIVE Sensitive     TRIMETH/SULFA <=10 SENSITIVE Sensitive     CLINDAMYCIN >=8 RESISTANT Resistant     RIFAMPIN <=0.5 SENSITIVE Sensitive     Inducible Clindamycin NEGATIVE  Sensitive     * ABUNDANT METHICILLIN RESISTANT STAPHYLOCOCCUS AUREUS  Culture, blood (routine x 2)     Status: None (Preliminary result)   Collection Time: 11/20/19 10:20 AM   Specimen: BLOOD RIGHT HAND  Result Value Ref Range Status   Specimen Description BLOOD RIGHT HAND  Final   Special Requests   Final    BOTTLES DRAWN AEROBIC ONLY Blood Culture adequate volume   Culture   Final    NO GROWTH 2 DAYS Performed at [facility], [address] [place], [place] [address]    Report Status PENDING  Incomplete  Culture, blood (routine x 2)     Status: None (Preliminary result)   Collection Time: 11/20/19 10:26 AM   Specimen: BLOOD  Result Value Ref Range Status   Specimen Description  BLOOD THUMB  Final   Special Requests   Final    BOTTLES DRAWN AEROBIC ONLY Blood Culture adequate volume   Culture  Setup Time   Final    GRAM POSITIVE COCCI AEROBIC BOTTLE ONLY Organism ID to follow CRITICAL RESULT CALLED TO, READ BACK BY AND VERIFIED WITH: Waymond Cera RN 11/22/19 0124 JDW Performed at [facility], [address], [place], [place] [address]    Culture GRAM POSITIVE COCCI  Final   Report Status PENDING  Incomplete  Blood Culture ID Panel (Reflexed)     Status: Abnormal   Collection Time: 11/20/19 10:26 AM  Result Value Ref Range Status   Enterococcus faecalis NOT DETECTED NOT DETECTED Final   Enterococcus Faecium NOT DETECTED NOT DETECTED Final   Listeria monocytogenes NOT DETECTED NOT DETECTED Final   Staphylococcus species DETECTED (A) NOT DETECTED Final    Comment: CRITICAL RESULT CALLED TO, READ BACK BY AND VERIFIED WITH: J BUSA RN 11/22/19 0124 JDW    Staphylococcus aureus (BCID) NOT DETECTED NOT DETECTED Final   Staphylococcus epidermidis NOT DETECTED NOT DETECTED Final   Staphylococcus lugdunensis NOT DETECTED NOT DETECTED Final   Streptococcus species NOT DETECTED NOT DETECTED Final   Streptococcus agalactiae NOT DETECTED NOT DETECTED Final   Streptococcus  pneumoniae NOT DETECTED NOT DETECTED Final   Streptococcus pyogenes NOT DETECTED NOT DETECTED Final   A.calcoaceticus-baumannii NOT DETECTED NOT DETECTED Final   Bacteroides fragilis NOT DETECTED NOT DETECTED Final   Enterobacterales NOT DETECTED NOT DETECTED Final   Enterobacter cloacae complex NOT DETECTED NOT DETECTED Final   Escherichia coli NOT DETECTED NOT DETECTED Final   Klebsiella aerogenes NOT DETECTED NOT DETECTED Final   Klebsiella oxytoca NOT DETECTED NOT DETECTED Final   Klebsiella pneumoniae NOT DETECTED NOT DETECTED Final   Proteus species NOT DETECTED NOT DETECTED Final   Salmonella species NOT DETECTED NOT DETECTED Final   Serratia marcescens NOT DETECTED NOT DETECTED Final   Haemophilus influenzae NOT DETECTED NOT DETECTED Final   Neisseria meningitidis NOT DETECTED NOT DETECTED Final   Pseudomonas aeruginosa NOT DETECTED NOT DETECTED Final   Stenotrophomonas maltophilia NOT DETECTED NOT DETECTED Final   Candida albicans NOT DETECTED NOT DETECTED Final   Candida auris NOT DETECTED NOT DETECTED Final   Candida glabrata NOT DETECTED NOT DETECTED Final   Candida krusei NOT DETECTED NOT DETECTED Final   Candida parapsilosis NOT DETECTED NOT DETECTED Final   Candida tropicalis NOT DETECTED NOT DETECTED Final   Cryptococcus neoformans/gattii NOT DETECTED NOT DETECTED Final    Comment: Performed at [facility], [address], [place], [place] [address]       Radiology Studies: DG Abd 1 View  Result Date: 11/22/2019 CLINICAL DATA:  Ileus. EXAM: ABDOMEN - 1 VIEW COMPARISON:  Single-view of the abdomen 11/21/2019 and 11/19/2019. FINDINGS: NG tube is in place in good position. Gaseous distention of the colon persists but appears mildly improved. Volume of stool in the ascending colon has decreased. Rectal temperature probe noted. IMPRESSION: Mild decrease in gaseous distention of the colon. Stool volume in the ascending colon also appears decreased. No new abnormality.  Electronically Signed   By: Drusilla Kanner M.D.   On: 11/22/2019 14:28   DG Abd 1 View  Result Date: 11/21/2019 CLINICAL DATA:  Ileus EXAM: ABDOMEN - 1 VIEW COMPARISON:  11/19/2019 FINDINGS: NG tube tip is in the distal stomach, in the region of the pylorus. No gaseous small bowel dilatation. Diffuse gaseous distention of right and  transverse colon is similar to prior. Left colon appears relatively decompressed. Visualized bony anatomy unremarkable. IMPRESSION: 1. NG tube tip is in the distal stomach. 2. Stable diffuse gaseous distention of the right and transverse colon. Electronically Signed   By: Kennith Center M.D.   On: 11/21/2019 06:38   DG CHEST PORT 1 VIEW  Result Date: 11/21/2019 CLINICAL DATA:  Pleural effusion. EXAM: PORTABLE CHEST 1 VIEW COMPARISON:  11/19/2019 FINDINGS: 0619 hours. Tracheostomy tube again noted. The NG tube passes into the stomach although the distal tip position is not included on the film. Cardiopericardial silhouette is at upper limits of normal for size. Diffuse interstitial opacity is similar to prior with basilar atelectasis and layering bilateral pleural effusions, right greater than left. The visualized bony structures of the thorax show no acute abnormality. Telemetry leads overlie the chest. IMPRESSION: No substantial change. Pulmonary edema pattern with layering bilateral pleural effusions, right greater than left. Electronically Signed   By: Kennith Center M.D.   On: 11/21/2019 06:39    Scheduled Meds: Please see MAR   Vonzella Nipple, MD  11/22/2019, 4:08 PM

## 2019-11-22 NOTE — Procedures (Signed)
Patient Name: Connor Roach  MRN: [id number]  Epilepsy Attending: Charlsie Quest  Referring Physician/Provider: Camelia Eng, NP Date: 11/22/2019 Duration: 24.33 mins  Patient history: 60yo M with h/o epilepsy with ams. EEG to evaluate for seizure  Level of alertness:  lethargic  AEDs during EEG study: None  Technical aspects: This EEG study was done with scalp electrodes positioned according to the 10-20 International system of electrode placement. Electrical activity was acquired at a sampling rate of 500Hz  and reviewed with a high frequency filter of 70Hz  and a low frequency filter of 1Hz . EEG data were recorded continuously and digitally stored.   Description: EEG showed continuous generalized rhythmic 1-2Hz  delta slowing. Hyperventilation and photic stimulation were not performed.     Of note, study was technically difficult due to significant myogenic and electrode artifact.   ABNORMALITY -Continuous rhythmic delta slow, generalized  IMPRESSION: This study is suggestive of severe diffuse encephalopathy, nonspecific etiology. No seizures or epileptiform discharges were seen throughout the recording.  [name] 

## 2019-11-23 ENCOUNTER — Other Ambulatory Visit (HOSPITAL_COMMUNITY): Payer: BC Managed Care – PPO

## 2019-11-23 DIAGNOSIS — J9621 Acute and chronic respiratory failure with hypoxia: Secondary | ICD-10-CM | POA: Diagnosis not present

## 2019-11-23 DIAGNOSIS — I482 Chronic atrial fibrillation, unspecified: Secondary | ICD-10-CM | POA: Diagnosis not present

## 2019-11-23 DIAGNOSIS — G9341 Metabolic encephalopathy: Secondary | ICD-10-CM | POA: Diagnosis not present

## 2019-11-23 DIAGNOSIS — J449 Chronic obstructive pulmonary disease, unspecified: Secondary | ICD-10-CM | POA: Diagnosis not present

## 2019-11-23 LAB — CULTURE, RESPIRATORY W GRAM STAIN: Special Requests: NORMAL

## 2019-11-23 LAB — BASIC METABOLIC PANEL
Anion gap: 9 (ref 5–15)
BUN: 20 mg/dL (ref 6–20)
CO2: 33 mmol/L — ABNORMAL HIGH (ref 22–32)
Calcium: 7.5 mg/dL — ABNORMAL LOW (ref 8.9–10.3)
Chloride: 97 mmol/L — ABNORMAL LOW (ref 98–111)
Creatinine, Ser: 0.66 mg/dL (ref 0.61–1.24)
GFR calc non Af Amer: >60 mL/min (ref >60)
Glucose, Bld: 117 mg/dL — ABNORMAL HIGH (ref 70–99)
Potassium: 3.7 mmol/L (ref 3.5–5.1)
Sodium: 139 mmol/L (ref 135–145)

## 2019-11-23 LAB — CULTURE, BLOOD (ROUTINE X 2): Special Requests: ADEQUATE

## 2019-11-23 LAB — VANCOMYCIN, TROUGH: Vancomycin Tr: 10 ug/mL — ABNORMAL LOW (ref 15–20)

## 2019-11-23 LAB — MAGNESIUM: Magnesium: 2.1 mg/dL (ref 1.7–2.4)

## 2019-11-23 NOTE — Progress Notes (Addendum)
Pulmonary Critical Care Medicine [facility]   PULMONARY CRITICAL CARE SERVICE  PROGRESS NOTE  Date of Service: 11/23/2019  Connor Roach  ZDG:[id number]  DOB: 1960/04/08   DOA: 10/30/2019  Referring Physician: Carron Curie, MD  HPI: Connor Roach is a 59 y.o. male seen for follow up of Acute on Chronic Respiratory Failure.  Patient mains on 20% aerosol trach collar satting well no fever distress this time.  Medications: Reviewed on Rounds  Physical Exam:  Vitals: Pulse 71 respirations 21 BP 143/64 O2 sat 96% temp 98.2  Ventilator Settings ATC 28%  . General: Comfortable at this time . Eyes: Grossly normal lids, irises & conjunctiva . ENT: grossly tongue is normal . Neck: no obvious mass . Cardiovascular: S1 S2 normal no gallop . Respiratory: Coarse breath sounds . Abdomen: soft . Skin: no rash seen on limited exam . Musculoskeletal: not rigid . Psychiatric:unable to assess . Neurologic: no seizure no involuntary movements         Lab Data:   Basic Metabolic Panel: Recent Labs  Lab 11/17/19 0601 11/17/19 0601 11/19/19 0623 11/20/19 0641 11/21/19 1113 11/22/19 0533 11/23/19 0438  NA 139   < > 142 142 140 142 139  K 3.7   < > 3.4* 3.2* 3.0* 2.9* 3.7  CL 106   < > 107 101 97* 97* 97*  CO2 23   < > 29 32 34* 36* 33*  GLUCOSE 133*   < > 112* 122* 117* 134* 117*  BUN 22*   < > 23* 23* 23* 24* 20  CREATININE 0.91   < > 0.88 0.92 0.82 0.79 0.66  CALCIUM 7.5*   < > 7.8* 7.6* 7.5* 7.9* 7.5*  MG 2.0  --  2.0  --   --  1.6* 2.1  PHOS 2.5  --  2.3*  --  2.1* 2.6  --    < > = values in this interval not displayed.    ABG: No results for input(s): PHART, PCO2ART, PO2ART, HCO3, O2SAT in the last 168 hours.  Liver Function Tests: Recent Labs  Lab 11/21/19 1113 11/22/19 0533  ALBUMIN 1.4* 1.4*   No results for input(s): LIPASE, AMYLASE in the last 168 hours. Recent Labs  Lab 11/20/19 0641  AMMONIA 76*    CBC: Recent Labs  Lab  11/17/19 0601 11/19/19 0623 11/21/19 1113  WBC 10.1 7.4 7.9  HGB 8.7* 7.0* 9.7*  HCT 26.9* 22.6* 30.4*  MCV 92.1 93.4 90.5  PLT 228 208 191    Cardiac Enzymes: No results for input(s): CKTOTAL, CKMB, CKMBINDEX, TROPONINI in the last 168 hours.  BNP (last 3 results) No results for input(s): BNP in the last 8760 hours.  ProBNP (last 3 results) No results for input(s): PROBNP in the last 8760 hours.  Radiological Exams: DG Abd 1 View  Result Date: 11/22/2019 CLINICAL DATA:  Ileus. EXAM: ABDOMEN - 1 VIEW COMPARISON:  Single-view of the abdomen 11/21/2019 and 11/19/2019. FINDINGS: NG tube is in place in good position. Gaseous distention of the colon persists but appears mildly improved. Volume of stool in the ascending colon has decreased. Rectal temperature probe noted. IMPRESSION: Mild decrease in gaseous distention of the colon. Stool volume in the ascending colon also appears decreased. No new abnormality. Electronically Signed   By: Drusilla Kanner M.D.   On: 11/22/2019 14:28   DG Abd Portable 1V  Result Date: 11/23/2019 CLINICAL DATA:  Ileus EXAM: PORTABLE ABDOMEN - 1 VIEW COMPARISON:  Yesterday FINDINGS:  The enteric tube tip is at the distal stomach. Gas seen throughout nondilated colon which measures up to 5.5 cm at the transverse segment. No small bowel dilatation. No concerning mass effect or gas collection. Right base opacity known from prior chest x-ray. IMPRESSION: Diffuse colonic gas without obstructive pattern or over distension to suggest ongoing ileus. Electronically Signed   By: Marnee Spring M.D.   On: 11/23/2019 05:54   EEG adult  Result Date: 11/22/2019 Charlsie Quest, MD     11/22/2019  6:38 PM Patient Name: Connor Roach MRN: [id number] Epilepsy Attending: Charlsie Quest Referring Physician/Provider: Camelia Eng, NP Date: 11/22/2019 Duration: 24.33 mins Patient history: 59yo M with h/o epilepsy with ams. EEG to evaluate for seizure Level of alertness:   lethargic AEDs during EEG study: None Technical aspects: This EEG study was done with scalp electrodes positioned according to the 10-20 International system of electrode placement. Electrical activity was acquired at a sampling rate of 500Hz  and reviewed with a high frequency filter of 70Hz  and a low frequency filter of 1Hz . EEG data were recorded continuously and digitally stored. Description: EEG showed continuous generalized rhythmic 1-2Hz  delta slowing. Hyperventilation and photic stimulation were not performed.   Of note, study was technically difficult due to significant myogenic and electrode artifact. ABNORMALITY -Continuous rhythmic delta slow, generalized IMPRESSION: This study is suggestive of severe diffuse encephalopathy, nonspecific etiology. No seizures or epileptiform discharges were seen throughout the recording. Priyanka    Assessment/Plan Active Problems:   Acute on chronic respiratory failure with hypoxia (HCC)   Chronic atrial fibrillation (HCC)   Seizure disorder (HCC)   Metabolic encephalopathy   COPD, severe (HCC)   1. Acute on chronic respiratory failure with hypoxia patient will continue with trach collar trials at this time trach will be downsized to #6 cuffless continue supportive measures pulmonary toilet. 2. Chronic atrial fibrillation rate is controlled we will continue with supportive care 3. Seizure disorder no active seizure noted. 4. Metabolic encephalopathy at baseline 5. Severe COPD medical management we will continue to follow   I have personally seen and evaluated the patient, evaluated laboratory and imaging results, formulated the assessment and plan and placed orders. The Patient requires high complexity decision making with multiple systems involvement.  Rounds were done with the Respiratory Therapy Director and Staff therapists and discussed with nursing staff also.  , MD [facility] Pulmonary Critical Care Medicine Sleep  Medicine

## 2019-11-24 DIAGNOSIS — J9621 Acute and chronic respiratory failure with hypoxia: Secondary | ICD-10-CM | POA: Diagnosis not present

## 2019-11-24 DIAGNOSIS — I482 Chronic atrial fibrillation, unspecified: Secondary | ICD-10-CM | POA: Diagnosis not present

## 2019-11-24 DIAGNOSIS — J449 Chronic obstructive pulmonary disease, unspecified: Secondary | ICD-10-CM | POA: Diagnosis not present

## 2019-11-24 DIAGNOSIS — G9341 Metabolic encephalopathy: Secondary | ICD-10-CM | POA: Diagnosis not present

## 2019-11-24 LAB — BASIC METABOLIC PANEL
Anion gap: 8 (ref 5–15)
BUN: 20 mg/dL (ref 6–20)
CO2: 35 mmol/L — ABNORMAL HIGH (ref 22–32)
Calcium: 7.6 mg/dL — ABNORMAL LOW (ref 8.9–10.3)
Chloride: 98 mmol/L (ref 98–111)
Creatinine, Ser: 0.62 mg/dL (ref 0.61–1.24)
GFR, Estimated: >60 mL/min (ref >60)
Glucose, Bld: 116 mg/dL — ABNORMAL HIGH (ref 70–99)
Potassium: 3.5 mmol/L (ref 3.5–5.1)
Sodium: 141 mmol/L (ref 135–145)

## 2019-11-24 LAB — CBC
HCT: 27.9 % — ABNORMAL LOW (ref 39.0–52.0)
Hemoglobin: 8.6 g/dL — ABNORMAL LOW (ref 13.0–17.0)
MCH: 28.5 pg (ref 26.0–34.0)
MCHC: 30.8 g/dL (ref 30.0–36.0)
MCV: 92.4 fL (ref 80.0–100.0)
Platelets: 124 10*3/uL — ABNORMAL LOW (ref 150–400)
RBC: 3.02 MIL/uL — ABNORMAL LOW (ref 4.22–5.81)
RDW: 14.5 % (ref 11.5–15.5)
WBC: 7.4 10*3/uL (ref 4.0–10.5)
nRBC: 0 % (ref 0.0–0.2)

## 2019-11-24 LAB — VALPROIC ACID LEVEL: Valproic Acid Lvl: 56 ug/mL (ref 50.0–100.0)

## 2019-11-24 LAB — LEVETIRACETAM LEVEL: Levetiracetam Lvl: 23.6 ug/mL (ref 10.0–40.0)

## 2019-11-24 LAB — AMMONIA: Ammonia: 55 umol/L — ABNORMAL HIGH (ref 9–35)

## 2019-11-24 LAB — MAGNESIUM: Magnesium: 2.1 mg/dL (ref 1.7–2.4)

## 2019-11-24 NOTE — Progress Notes (Signed)
Pulmonary Critical Care Medicine [facility] [place]   PULMONARY CRITICAL CARE SERVICE  PROGRESS NOTE  Date of Service: 11/24/2019  Connor Roach  AOZ:[id number]  DOB: [birth year]   DOA: 10/30/2019  Referring Physician: Carron Curie, MD  HPI: Connor Roach is a 59 y.o. male seen for follow up of Acute on Chronic Respiratory Failure.  Patient at this time is on T collar has been on 20% FiO2 with excellent saturations secretions are still quite copious  Medications: Reviewed on Rounds  Physical Exam:  Vitals: Temperature is 98.8 pulse 76 respiratory rate is 20 blood pressure is 130/64 saturations 95%  Ventilator Settings on T collar FiO2 28%  . General: Comfortable at this time . Eyes: Grossly normal lids, irises & conjunctiva . ENT: grossly tongue is normal . Neck: no obvious mass . Cardiovascular: S1 S2 normal no gallop . Respiratory: Scattered rhonchi expansion is equal . Abdomen: soft . Skin: no rash seen on limited exam . Musculoskeletal: not rigid . Psychiatric:unable to assess . Neurologic: no seizure no involuntary movements         Lab Data:   Basic Metabolic Panel: Recent Labs  Lab 11/19/19 0623 11/19/19 0623 11/20/19 0641 11/21/19 1113 11/22/19 0533 11/23/19 0438 11/24/19 0601  NA 142   < > 142 140 142 139 141  K 3.4*   < > 3.2* 3.0* 2.9* 3.7 3.5  CL 107   < > 101 97* 97* 97* 98  CO2 29   < > 32 34* 36* 33* 35*  GLUCOSE 112*   < > 122* 117* 134* 117* 116*  BUN 23*   < > 23* 23* 24* 20 20  CREATININE 0.88   < > 0.92 0.82 0.79 0.66 0.62  CALCIUM 7.8*   < > 7.6* 7.5* 7.9* 7.5* 7.6*  MG 2.0  --   --   --  1.6* 2.1 2.1  PHOS 2.3*  --   --  2.1* 2.6  --   --    < > = values in this interval not displayed.    ABG: No results for input(s): PHART, PCO2ART, PO2ART, HCO3, O2SAT in the last 168 hours.  Liver Function Tests: Recent Labs  Lab 11/21/19 1113 11/22/19 0533  ALBUMIN 1.4* 1.4*   No results for input(s): LIPASE, AMYLASE in the  last 168 hours. Recent Labs  Lab 11/20/19 0641 11/24/19 0601  AMMONIA 76* 55*    CBC: Recent Labs  Lab 11/19/19 0623 11/21/19 1113 11/24/19 0601  WBC 7.4 7.9 7.4  HGB 7.0* 9.7* 8.6*  HCT 22.6* 30.4* 27.9*  MCV 93.4 90.5 92.4  PLT 208 191 124*    Cardiac Enzymes: No results for input(s): CKTOTAL, CKMB, CKMBINDEX, TROPONINI in the last 168 hours.  BNP (last 3 results) No results for input(s): BNP in the last 8760 hours.  ProBNP (last 3 results) No results for input(s): PROBNP in the last 8760 hours.  Radiological Exams: DG Abd 1 View  Result Date: 11/22/2019 CLINICAL DATA:  Ileus. EXAM: ABDOMEN - 1 VIEW COMPARISON:  Single-view of the abdomen 11/21/2019 and 11/19/2019. FINDINGS: NG tube is in place in good position. Gaseous distention of the colon persists but appears mildly improved. Volume of stool in the ascending colon has decreased. Rectal temperature probe noted. IMPRESSION: Mild decrease in gaseous distention of the colon. Stool volume in the ascending colon also appears decreased. No new abnormality. Electronically Signed   By: Drusilla Kanner M.D.   On: 11/22/2019 14:28   DG  Abd Portable 1V  Result Date: 11/23/2019 CLINICAL DATA:  Ileus EXAM: PORTABLE ABDOMEN - 1 VIEW COMPARISON:  Yesterday FINDINGS: The enteric tube tip is at the distal stomach. Gas seen throughout nondilated colon which measures up to 5.5 cm at the transverse segment. No small bowel dilatation. No concerning mass effect or gas collection. Right base opacity known from prior chest x-ray. IMPRESSION: Diffuse colonic gas without obstructive pattern or over distension to suggest ongoing ileus. Electronically Signed   By: Marnee Spring M.D.   On: 11/23/2019 05:54   EEG adult  Result Date: 11/22/2019 Connor Quest, MD     11/22/2019  6:38 PM Patient Name: Connor Roach MRN: [id number] Epilepsy Attending: Charlsie Roach Referring Physician/Provider: Camelia Eng, NP Date: 11/22/2019 Duration:  24.33 mins Patient history: 59yo M with h/o epilepsy with ams. EEG to evaluate for seizure Level of alertness:  lethargic AEDs during EEG study: None Technical aspects: This EEG study was done with scalp electrodes positioned according to the 10-20 International system of electrode placement. Electrical activity was acquired at a sampling rate of 500Hz  and reviewed with a high frequency filter of 70Hz  and a low frequency filter of 1Hz . EEG data were recorded continuously and digitally stored. Description: EEG showed continuous generalized rhythmic 1-2Hz  delta slowing. Hyperventilation and photic stimulation were not performed.   Of note, study was technically difficult due to significant myogenic and electrode artifact. ABNORMALITY -Continuous rhythmic delta slow, generalized IMPRESSION: This study is suggestive of severe diffuse encephalopathy, nonspecific etiology. No seizures or epileptiform discharges were seen throughout the recording. Connor Roach    Assessment/Plan Active Problems:   Acute on chronic respiratory failure with hypoxia (HCC)   Chronic atrial fibrillation (HCC)   Seizure disorder (HCC)   Metabolic encephalopathy   COPD, severe (HCC)   1. Acute on chronic respiratory failure hypoxia we will continue with T collar trials titrate oxygen as tolerated secretions are limiting factor 2. Chronic atrial fibrillation rate is controlled 3. Seizure disorder no active seizure noted at this time 4. Metabolic encephalopathy no change 5. Severe COPD at baseline we will continue to follow along   I have personally seen and evaluated the patient, evaluated laboratory and imaging results, formulated the assessment and plan and placed orders. The Patient requires high complexity decision making with multiple systems involvement.  Rounds were done with the Respiratory Therapy Director and Staff therapists and discussed with nursing staff also.  , MD [facility] Pulmonary Critical  Care Medicine Sleep Medicine

## 2019-11-25 ENCOUNTER — Other Ambulatory Visit (HOSPITAL_COMMUNITY): Payer: BC Managed Care – PPO

## 2019-11-25 DIAGNOSIS — G9341 Metabolic encephalopathy: Secondary | ICD-10-CM | POA: Diagnosis not present

## 2019-11-25 DIAGNOSIS — I482 Chronic atrial fibrillation, unspecified: Secondary | ICD-10-CM | POA: Diagnosis not present

## 2019-11-25 DIAGNOSIS — J449 Chronic obstructive pulmonary disease, unspecified: Secondary | ICD-10-CM | POA: Diagnosis not present

## 2019-11-25 DIAGNOSIS — J9621 Acute and chronic respiratory failure with hypoxia: Secondary | ICD-10-CM | POA: Diagnosis not present

## 2019-11-25 LAB — BASIC METABOLIC PANEL
Anion gap: 7 (ref 5–15)
BUN: 18 mg/dL (ref 6–20)
CO2: 37 mmol/L — ABNORMAL HIGH (ref 22–32)
Calcium: 7.8 mg/dL — ABNORMAL LOW (ref 8.9–10.3)
Chloride: 97 mmol/L — ABNORMAL LOW (ref 98–111)
Creatinine, Ser: 0.63 mg/dL (ref 0.61–1.24)
GFR, Estimated: >60 mL/min (ref >60)
Glucose, Bld: 109 mg/dL — ABNORMAL HIGH (ref 70–99)
Potassium: 3.6 mmol/L (ref 3.5–5.1)
Sodium: 141 mmol/L (ref 135–145)

## 2019-11-25 LAB — MAGNESIUM: Magnesium: 1.9 mg/dL (ref 1.7–2.4)

## 2019-11-25 LAB — CULTURE, BLOOD (ROUTINE X 2)
Culture: NO GROWTH
Special Requests: ADEQUATE

## 2019-11-25 LAB — PHOSPHORUS: Phosphorus: 3 mg/dL (ref 2.5–4.6)

## 2019-11-25 LAB — TRIGLYCERIDES: Triglycerides: 109 mg/dL (ref <150)

## 2019-11-25 NOTE — Progress Notes (Signed)
Pulmonary Critical Care Medicine [facility] [place]   PULMONARY CRITICAL CARE SERVICE  PROGRESS NOTE  Date of Service: 11/25/2019  Connor Roach  [id number]:[id number]  DOB: 11-12-1960   DOA: 10/30/2019  Referring Physician: Carron Curie, MD  HPI: Connor Roach is a 59 y.o. male seen for follow up of Acute on Chronic Respiratory Failure.  Patient currently is on T collar has been on 20% FiO2  Medications: Reviewed on Rounds  Physical Exam:  Vitals: Temperature is 98.7 pulse 77 respiratory rate 17 blood pressure is 151/80 saturations 97%  Ventilator Settings on T collar currently on 28% FiO2  . General: Comfortable at this time . Eyes: Grossly normal lids, irises & conjunctiva . ENT: grossly tongue is normal . Neck: no obvious mass . Cardiovascular: S1 S2 normal no gallop . Respiratory: No rhonchi very coarse breath sounds . Abdomen: soft . Skin: no rash seen on limited exam . Musculoskeletal: not rigid . Psychiatric:unable to assess . Neurologic: no seizure no involuntary movements         Lab Data:   Basic Metabolic Panel: Recent Labs  Lab 11/19/19 0623 11/20/19 0641 11/21/19 1113 11/22/19 0533 11/23/19 0438 11/24/19 0601 11/25/19 0624  NA 142   < > 140 142 139 141 141  K 3.4*   < > 3.0* 2.9* 3.7 3.5 3.6  CL 107   < > 97* 97* 97* 98 97*  CO2 29   < > 34* 36* 33* 35* 37*  GLUCOSE 112*   < > 117* 134* 117* 116* 109*  BUN 23*   < > 23* 24* 20 20 18   CREATININE 0.88   < > 0.82 0.79 0.66 0.62 0.63  CALCIUM 7.8*   < > 7.5* 7.9* 7.5* 7.6* 7.8*  MG 2.0  --   --  1.6* 2.1 2.1 1.9  PHOS 2.3*  --  2.1* 2.6  --   --  3.0   < > = values in this interval not displayed.    ABG: No results for input(s): PHART, PCO2ART, PO2ART, HCO3, O2SAT in the last 168 hours.  Liver Function Tests: Recent Labs  Lab 11/21/19 1113 11/22/19 0533  ALBUMIN 1.4* 1.4*   No results for input(s): LIPASE, AMYLASE in the last 168 hours. Recent Labs  Lab 11/20/19 0641  11/24/19 0601  AMMONIA 76* 55*    CBC: Recent Labs  Lab 11/19/19 0623 11/21/19 1113 11/24/19 0601  WBC 7.4 7.9 7.4  HGB 7.0* 9.7* 8.6*  HCT 22.6* 30.4* 27.9*  MCV 93.4 90.5 92.4  PLT 208 191 124*    Cardiac Enzymes: No results for input(s): CKTOTAL, CKMB, CKMBINDEX, TROPONINI in the last 168 hours.  BNP (last 3 results) No results for input(s): BNP in the last 8760 hours.  ProBNP (last 3 results) No results for input(s): PROBNP in the last 8760 hours.  Radiological Exams: DG Abd Portable 1V  Result Date: 11/25/2019 CLINICAL DATA:  Ileus. EXAM: PORTABLE ABDOMEN - 1 VIEW COMPARISON:  Abdominal radiograph 11/23/2019. FINDINGS: Enteric tube tip and side-port project over the stomach. Opacity right lung base. Gas demonstrated within a nondilated colon. No evidence for small bowel obstruction. IMPRESSION: Enteric tube tip and side-port project over the stomach. Nonobstructive bowel gas pattern.  Diffuse colonic gas. Electronically Signed   By: 01/23/2020 M.D.   On: 11/25/2019 09:30    Assessment/Plan Active Problems:   Acute on chronic respiratory failure with hypoxia (HCC)   Chronic atrial fibrillation (HCC)   Seizure disorder (HCC)  Metabolic encephalopathy   COPD, severe (HCC)   1. Acute on chronic respiratory failure hypoxia we will continue with T collar trials titrate oxygen continue pulmonary toilet. 2. Chronic atrial fibrillation rate now rate controlled we will continue to follow 3. Seizure disorder there is no active seizures 4. Metabolic encephalopathy no change 5. Severe COPD at baseline we will continue to follow   I have personally seen and evaluated the patient, evaluated laboratory and imaging results, formulated the assessment and plan and placed orders. The Patient requires high complexity decision making with multiple systems involvement.  Rounds were done with the Respiratory Therapy Director and Staff therapists and discussed with nursing staff  also.  Yevonne Pax, MD [facility] Pulmonary Critical Care Medicine Sleep Medicine

## 2019-11-26 ENCOUNTER — Other Ambulatory Visit (HOSPITAL_COMMUNITY): Payer: BC Managed Care – PPO

## 2019-11-26 DIAGNOSIS — J9621 Acute and chronic respiratory failure with hypoxia: Secondary | ICD-10-CM | POA: Diagnosis not present

## 2019-11-26 DIAGNOSIS — J449 Chronic obstructive pulmonary disease, unspecified: Secondary | ICD-10-CM | POA: Diagnosis not present

## 2019-11-26 DIAGNOSIS — G9341 Metabolic encephalopathy: Secondary | ICD-10-CM | POA: Diagnosis not present

## 2019-11-26 DIAGNOSIS — I482 Chronic atrial fibrillation, unspecified: Secondary | ICD-10-CM | POA: Diagnosis not present

## 2019-11-26 NOTE — Progress Notes (Signed)
Pulmonary Critical Care Medicine [facility] [place]   PULMONARY CRITICAL CARE SERVICE  PROGRESS NOTE  Date of Service: 11/26/2019  Connor Roach  HDQ:[id number]  DOB: [birth year]   DOA: 10/30/2019  Referring Physician: Carron Curie, MD  HPI: Connor Roach is a 59 y.o. male seen for follow up of Acute on Chronic Respiratory Failure.  Patient is on T collar currently on 28% FiO2 secretions are fair to good saturations are noted  Medications: Reviewed on Rounds  Physical Exam:  Vitals: Temperature is 99.8 pulse 74 respiratory rate 20 blood pressure is 124/76 saturations 96%  Ventilator Settings on T collar FiO2 28%  . General: Comfortable at this time . Eyes: Grossly normal lids, irises & conjunctiva . ENT: grossly tongue is normal . Neck: no obvious mass . Cardiovascular: S1 S2 normal no gallop . Respiratory: Scattered rhonchi expansion is equal . Abdomen: soft . Skin: no rash seen on limited exam . Musculoskeletal: not rigid . Psychiatric:unable to assess . Neurologic: no seizure no involuntary movements         Lab Data:   Basic Metabolic Panel: Recent Labs  Lab 11/21/19 1113 11/22/19 0533 11/23/19 0438 11/24/19 0601 11/25/19 0624  NA 140 142 139 141 141  K 3.0* 2.9* 3.7 3.5 3.6  CL 97* 97* 97* 98 97*  CO2 34* 36* 33* 35* 37*  GLUCOSE 117* 134* 117* 116* 109*  BUN 23* 24* 20 20 18   CREATININE 0.82 0.79 0.66 0.62 0.63  CALCIUM 7.5* 7.9* 7.5* 7.6* 7.8*  MG  --  1.6* 2.1 2.1 1.9  PHOS 2.1* 2.6  --   --  3.0    ABG: No results for input(s): PHART, PCO2ART, PO2ART, HCO3, O2SAT in the last 168 hours.  Liver Function Tests: Recent Labs  Lab 11/21/19 1113 11/22/19 0533  ALBUMIN 1.4* 1.4*   No results for input(s): LIPASE, AMYLASE in the last 168 hours. Recent Labs  Lab 11/20/19 0641 11/24/19 0601  AMMONIA 76* 55*    CBC: Recent Labs  Lab 11/21/19 1113 11/24/19 0601  WBC 7.9 7.4  HGB 9.7* 8.6*  HCT 30.4* 27.9*  MCV 90.5 92.4   PLT 191 124*    Cardiac Enzymes: No results for input(s): CKTOTAL, CKMB, CKMBINDEX, TROPONINI in the last 168 hours.  BNP (last 3 results) No results for input(s): BNP in the last 8760 hours.  ProBNP (last 3 results) No results for input(s): PROBNP in the last 8760 hours.  Radiological Exams: DG CHEST PORT 1 VIEW  Result Date: 11/26/2019 CLINICAL DATA:  Pulmonary edema.  Pleural effusion. EXAM: PORTABLE CHEST 1 VIEW COMPARISON:  11/21/2019 FINDINGS: 0626 hours. Tracheostomy tube remains in place. The NG tube passes into the stomach although the distal tip position is not included on the film. Bibasilar collapse/consolidative opacity again noted, slightly progressive on the left. Small bilateral pleural effusions, right greater than left also similar in the interval. Telemetry leads overlie the chest. IMPRESSION: 1. Slight progression of retrocardiac collapse/consolidation. 2. Otherwise no substantial change. Electronically Signed   By: 01/21/2020 M.D.   On: 11/26/2019 07:55   DG Abd Portable 1V  Result Date: 11/25/2019 CLINICAL DATA:  Ileus. EXAM: PORTABLE ABDOMEN - 1 VIEW COMPARISON:  Abdominal radiograph 11/23/2019. FINDINGS: Enteric tube tip and side-port project over the stomach. Opacity right lung base. Gas demonstrated within a nondilated colon. No evidence for small bowel obstruction. IMPRESSION: Enteric tube tip and side-port project over the stomach. Nonobstructive bowel gas pattern.  Diffuse colonic gas. Electronically  Signed   By: Annia Belt M.D.   On: 11/25/2019 09:30    Assessment/Plan Active Problems:   Acute on chronic respiratory failure with hypoxia (HCC)   Chronic atrial fibrillation (HCC)   Seizure disorder (HCC)   Metabolic encephalopathy   COPD, severe (HCC)   1. Acute on chronic respiratory failure hypoxia we will continue T collar trials titrate oxygen continue pulmonary toilet. 2. Chronic atrial fibrillation rate is controlled continue to  monitor 3. Seizure disorder no active seizures noted at this time 4. Metabolic encephalopathy supportive care slow improvement 5. Severe COPD medical management   I have personally seen and evaluated the patient, evaluated laboratory and imaging results, formulated the assessment and plan and placed orders. The Patient requires high complexity decision making with multiple systems involvement.  Rounds were done with the Respiratory Therapy Director and Staff therapists and discussed with nursing staff also.  Yevonne Pax, MD [facility] Pulmonary Critical Care Medicine Sleep Medicine

## 2019-11-27 ENCOUNTER — Other Ambulatory Visit (HOSPITAL_COMMUNITY): Payer: BC Managed Care – PPO

## 2019-11-27 DIAGNOSIS — G9341 Metabolic encephalopathy: Secondary | ICD-10-CM | POA: Diagnosis not present

## 2019-11-27 DIAGNOSIS — J9621 Acute and chronic respiratory failure with hypoxia: Secondary | ICD-10-CM | POA: Diagnosis not present

## 2019-11-27 DIAGNOSIS — J449 Chronic obstructive pulmonary disease, unspecified: Secondary | ICD-10-CM | POA: Diagnosis not present

## 2019-11-27 DIAGNOSIS — I482 Chronic atrial fibrillation, unspecified: Secondary | ICD-10-CM | POA: Diagnosis not present

## 2019-11-27 LAB — CBC
HCT: 24.3 % — ABNORMAL LOW (ref 39.0–52.0)
Hemoglobin: 7.5 g/dL — ABNORMAL LOW (ref 13.0–17.0)
MCH: 29.5 pg (ref 26.0–34.0)
MCHC: 30.9 g/dL (ref 30.0–36.0)
MCV: 95.7 fL (ref 80.0–100.0)
Platelets: 142 10*3/uL — ABNORMAL LOW (ref 150–400)
RBC: 2.54 MIL/uL — ABNORMAL LOW (ref 4.22–5.81)
RDW: 14.6 % (ref 11.5–15.5)
WBC: 8.5 10*3/uL (ref 4.0–10.5)
nRBC: 0 % (ref 0.0–0.2)

## 2019-11-27 LAB — BASIC METABOLIC PANEL
Anion gap: 9 (ref 5–15)
BUN: 15 mg/dL (ref 6–20)
CO2: 36 mmol/L — ABNORMAL HIGH (ref 22–32)
Calcium: 7.6 mg/dL — ABNORMAL LOW (ref 8.9–10.3)
Chloride: 99 mmol/L (ref 98–111)
Creatinine, Ser: 0.84 mg/dL (ref 0.61–1.24)
GFR, Estimated: >60 mL/min (ref >60)
Glucose, Bld: 114 mg/dL — ABNORMAL HIGH (ref 70–99)
Potassium: 3.6 mmol/L (ref 3.5–5.1)
Sodium: 144 mmol/L (ref 135–145)

## 2019-11-27 LAB — PHOSPHORUS: Phosphorus: 3.9 mg/dL (ref 2.5–4.6)

## 2019-11-27 LAB — MAGNESIUM: Magnesium: 2 mg/dL (ref 1.7–2.4)

## 2019-11-27 LAB — LEVETIRACETAM LEVEL: Levetiracetam Lvl: 24.9 ug/mL (ref 10.0–40.0)

## 2019-11-27 NOTE — Progress Notes (Addendum)
Pulmonary Critical Care Medicine [facility] [place]   PULMONARY CRITICAL CARE SERVICE  PROGRESS NOTE  Date of Service: 11/27/2019  Connor Roach  [id number]:[id number]  DOB: November 17, 1960   DOA: 10/30/2019  Referring Physician: Carron Curie, MD  HPI: Connor Roach is a 59 y.o. male seen for follow up of Acute on Chronic Respiratory Failure.  Patient remains on 35% aerosol trach collar satting well no  distress.  Medications: Reviewed on Rounds  Physical Exam:  Vitals: Pulse 76 respirations 20 BP 115/61 O2 sat 100% temp 100.5  Ventilator Settings not currently on ventilator  . General: Comfortable at this time . Eyes: Grossly normal lids, irises & conjunctiva . ENT: grossly tongue is normal . Neck: no obvious mass . Cardiovascular: S1 S2 normal no gallop . Respiratory: Coarse breath sounds . Abdomen: soft . Skin: no rash seen on limited exam . Musculoskeletal: not rigid . Psychiatric:unable to assess . Neurologic: no seizure no involuntary movements         Lab Data:   Basic Metabolic Panel: Recent Labs  Lab 11/21/19 1113 11/21/19 1113 11/22/19 0533 11/23/19 0438 11/24/19 0601 11/25/19 0624 11/27/19 0656  NA 140   < > 142 139 141 141 144  K 3.0*   < > 2.9* 3.7 3.5 3.6 3.6  CL 97*   < > 97* 97* 98 97* 99  CO2 34*   < > 36* 33* 35* 37* 36*  GLUCOSE 117*   < > 134* 117* 116* 109* 114*  BUN 23*   < > 24* 20 20 18 15   CREATININE 0.82   < > 0.79 0.66 0.62 0.63 0.84  CALCIUM 7.5*   < > 7.9* 7.5* 7.6* 7.8* 7.6*  MG  --   --  1.6* 2.1 2.1 1.9 2.0  PHOS 2.1*  --  2.6  --   --  3.0 3.9   < > = values in this interval not displayed.    ABG: No results for input(s): PHART, PCO2ART, PO2ART, HCO3, O2SAT in the last 168 hours.  Liver Function Tests: Recent Labs  Lab 11/21/19 1113 11/22/19 0533  ALBUMIN 1.4* 1.4*   No results for input(s): LIPASE, AMYLASE in the last 168 hours. Recent Labs  Lab 11/24/19 0601  AMMONIA 55*    CBC: Recent Labs  Lab  11/21/19 1113 11/24/19 0601 11/27/19 0656  WBC 7.9 7.4 8.5  HGB 9.7* 8.6* 7.5*  HCT 30.4* 27.9* 24.3*  MCV 90.5 92.4 95.7  PLT 191 124* 142*    Cardiac Enzymes: No results for input(s): CKTOTAL, CKMB, CKMBINDEX, TROPONINI in the last 168 hours.  BNP (last 3 results) No results for input(s): BNP in the last 8760 hours.  ProBNP (last 3 results) No results for input(s): PROBNP in the last 8760 hours.  Radiological Exams: DG Abd 1 View  Result Date: 11/27/2019 CLINICAL DATA:  Ileus. EXAM: ABDOMEN - 1 VIEW COMPARISON:  Single-view of the abdomen 11/25/2019 and 11/23/2019. FINDINGS: NG tube remains in place in good position. Gaseous distention of the colon persists and has worsened in the ascending colon. No small bowel dilatation. No abnormal abdominal calcification or focal bony abnormality. Rectal temperature probe noted. IMPRESSION: Increased gaseous distention of the ascending colon. Negative for small bowel obstruction. NG tube in good position. Electronically Signed   By: 01/23/2020 M.D.   On: 11/27/2019 07:44   DG CHEST PORT 1 VIEW  Result Date: 11/26/2019 CLINICAL DATA:  Pulmonary edema.  Pleural effusion. EXAM: PORTABLE CHEST 1  VIEW COMPARISON:  11/21/2019 FINDINGS: 0626 hours. Tracheostomy tube remains in place. The NG tube passes into the stomach although the distal tip position is not included on the film. Bibasilar collapse/consolidative opacity again noted, slightly progressive on the left. Small bilateral pleural effusions, right greater than left also similar in the interval. Telemetry leads overlie the chest. IMPRESSION: 1. Slight progression of retrocardiac collapse/consolidation. 2. Otherwise no substantial change. Electronically Signed   By: Kennith Center M.D.   On: 11/26/2019 07:55    Assessment/Plan Active Problems:   Acute on chronic respiratory failure with hypoxia (HCC)   Chronic atrial fibrillation (HCC)   Seizure disorder (HCC)   Metabolic  encephalopathy   COPD, severe (HCC)   1. Acute on chronic respiratory failure hypoxia continue aggressive pulmonary toilet and supportive measures continue to wean on aerosol trach collar. 2. Chronic atrial fibrillation rate is controlled continue to monitor 3. Seizure disorder no active seizures noted at this time 4. Metabolic encephalopathy supportive care slow improvement 5. Severe COPD medical management   I have personally seen and evaluated the patient, evaluated laboratory and imaging results, formulated the assessment and plan and placed orders. The Patient requires high complexity decision making with multiple systems involvement.  Rounds were done with the Respiratory Therapy Director and Staff therapists and discussed with nursing staff also.  Yevonne Pax, MD [facility] Pulmonary Critical Care Medicine Sleep Medicine

## 2019-11-28 ENCOUNTER — Other Ambulatory Visit (HOSPITAL_COMMUNITY): Payer: BC Managed Care – PPO

## 2019-11-28 DIAGNOSIS — I482 Chronic atrial fibrillation, unspecified: Secondary | ICD-10-CM | POA: Diagnosis not present

## 2019-11-28 DIAGNOSIS — J9621 Acute and chronic respiratory failure with hypoxia: Secondary | ICD-10-CM | POA: Diagnosis not present

## 2019-11-28 DIAGNOSIS — G9341 Metabolic encephalopathy: Secondary | ICD-10-CM | POA: Diagnosis not present

## 2019-11-28 DIAGNOSIS — J449 Chronic obstructive pulmonary disease, unspecified: Secondary | ICD-10-CM | POA: Diagnosis not present

## 2019-11-28 LAB — PHOSPHORUS: Phosphorus: 3.8 mg/dL (ref 2.5–4.6)

## 2019-11-28 LAB — MAGNESIUM: Magnesium: 2 mg/dL (ref 1.7–2.4)

## 2019-11-28 LAB — VANCOMYCIN, TROUGH: Vancomycin Tr: 18 ug/mL (ref 15–20)

## 2019-11-28 NOTE — Progress Notes (Addendum)
Pulmonary Critical Care Medicine [facility] [place]   PULMONARY CRITICAL CARE SERVICE  PROGRESS NOTE  Date of Service: 11/28/2019  Connor Roach  [id number]  DOB: Dec 06, 1960   DOA: 10/30/2019  Referring Physician: Carron Curie, MD  HPI: Connor Roach is a 59 y.o. male seen for follow up of Acute on Chronic Respiratory Failure.  Patient continue on aerosol trach collar at this time satting well no distress.  Medications: Reviewed on Rounds  Physical Exam:  Vitals: Pulse 70 respirations 20 BP 97/50 O2 sat 93% temp 96.8  Ventilator Settings aerosol trach collar 35%  . General: Comfortable at this time . Eyes: Grossly normal lids, irises & conjunctiva . ENT: grossly tongue is normal . Neck: no obvious mass . Cardiovascular: S1 S2 normal no gallop . Respiratory: No rales or rhonchi noted . Abdomen: soft . Skin: no rash seen on limited exam . Musculoskeletal: not rigid . Psychiatric:unable to assess . Neurologic: no seizure no involuntary movements         Lab Data:   Basic Metabolic Panel: Recent Labs  Lab 11/22/19 0533 11/22/19 0533 11/23/19 0347 11/24/19 0601 11/25/19 0624 11/27/19 0656 11/28/19 0452  NA 142  --  139 141 141 144  --   K 2.9*  --  3.7 3.5 3.6 3.6  --   CL 97*  --  97* 98 97* 99  --   CO2 36*  --  33* 35* 37* 36*  --   GLUCOSE 134*  --  117* 116* 109* 114*  --   BUN 24*  --  20 20 18 15   --   CREATININE 0.79  --  0.66 0.62 0.63 0.84  --   CALCIUM 7.9*  --  7.5* 7.6* 7.8* 7.6*  --   MG 1.6*   < > 2.1 2.1 1.9 2.0 2.0  PHOS 2.6  --   --   --  3.0 3.9 3.8   < > = values in this interval not displayed.    ABG: No results for input(s): PHART, PCO2ART, PO2ART, HCO3, O2SAT in the last 168 hours.  Liver Function Tests: Recent Labs  Lab 11/22/19 0533  ALBUMIN 1.4*   No results for input(s): LIPASE, AMYLASE in the last 168 hours. Recent Labs  Lab 11/24/19 0601  AMMONIA 55*    CBC: Recent Labs  Lab 11/24/19 0601  11/27/19 0656  WBC 7.4 8.5  HGB 8.6* 7.5*  HCT 27.9* 24.3*  MCV 92.4 95.7  PLT 124* 142*    Cardiac Enzymes: No results for input(s): CKTOTAL, CKMB, CKMBINDEX, TROPONINI in the last 168 hours.  BNP (last 3 results) No results for input(s): BNP in the last 8760 hours.  ProBNP (last 3 results) No results for input(s): PROBNP in the last 8760 hours.  Radiological Exams: DG Abd 1 View  Result Date: 11/27/2019 CLINICAL DATA:  Ileus. EXAM: ABDOMEN - 1 VIEW COMPARISON:  Single-view of the abdomen 11/25/2019 and 11/23/2019. FINDINGS: NG tube remains in place in good position. Gaseous distention of the colon persists and has worsened in the ascending colon. No small bowel dilatation. No abnormal abdominal calcification or focal bony abnormality. Rectal temperature probe noted. IMPRESSION: Increased gaseous distention of the ascending colon. Negative for small bowel obstruction. NG tube in good position. Electronically Signed   By: 01/23/2020 M.D.   On: 11/27/2019 07:44    Assessment/Plan Active Problems:   Acute on chronic respiratory failure with hypoxia (HCC)   Chronic atrial fibrillation (HCC)  Seizure disorder (HCC)   Metabolic encephalopathy   COPD, severe (HCC)   1. Acute on chronic respiratory failure hypoxia  patient will continue to wean on aerosol trach collar 35% FiO2 we will continue supportive measures and aggressive pulmonary toilet. 2. Chronic atrial fibrillation rate is controlled continue to monitor 3. Seizure disorder no active seizures noted at this time 4. Metabolic encephalopathy supportive care slow improvement 5. Severe COPD medical management   I have personally seen and evaluated the patient, evaluated laboratory and imaging results, formulated the assessment and plan and placed orders. The Patient requires high complexity decision making with multiple systems involvement.  Rounds were done with the Respiratory Therapy Director and Staff therapists  and discussed with nursing staff also.  Yevonne Pax, MD [facility] Pulmonary Critical Care Medicine Sleep Medicine

## 2019-11-29 DIAGNOSIS — I482 Chronic atrial fibrillation, unspecified: Secondary | ICD-10-CM | POA: Diagnosis not present

## 2019-11-29 DIAGNOSIS — J9621 Acute and chronic respiratory failure with hypoxia: Secondary | ICD-10-CM | POA: Diagnosis not present

## 2019-11-29 DIAGNOSIS — J449 Chronic obstructive pulmonary disease, unspecified: Secondary | ICD-10-CM | POA: Diagnosis not present

## 2019-11-29 DIAGNOSIS — G9341 Metabolic encephalopathy: Secondary | ICD-10-CM | POA: Diagnosis not present

## 2019-11-29 LAB — BLOOD GAS, ARTERIAL
Acid-Base Excess: 11.4 mmol/L — ABNORMAL HIGH (ref 0.0–2.0)
Acid-Base Excess: 11 mmol/L — ABNORMAL HIGH (ref 0.0–2.0)
Bicarbonate: 35.8 mmol/L — ABNORMAL HIGH (ref 20.0–28.0)
Bicarbonate: 35.7 mmol/L — ABNORMAL HIGH (ref 20.0–28.0)
FIO2: 35
FIO2: 45
O2 Saturation: 80.2 %
O2 Saturation: 81.6 %
Patient temperature: 36.8
Patient temperature: 36.6
pCO2 arterial: 49.4 mmHg — ABNORMAL HIGH (ref 32.0–48.0)
pCO2 arterial: 51.8 mmHg — ABNORMAL HIGH (ref 32.0–48.0)
pH, Arterial: 7.473 — ABNORMAL HIGH (ref 7.350–7.450)
pH, Arterial: 7.451 — ABNORMAL HIGH (ref 7.350–7.450)
pO2, Arterial: 45.5 mmHg — ABNORMAL LOW (ref 83.0–108.0)
pO2, Arterial: 48.3 mmHg — ABNORMAL LOW (ref 83.0–108.0)

## 2019-11-29 LAB — CBC
HCT: 25.3 % — ABNORMAL LOW (ref 39.0–52.0)
Hemoglobin: 7.7 g/dL — ABNORMAL LOW (ref 13.0–17.0)
MCH: 28.2 pg (ref 26.0–34.0)
MCHC: 30.4 g/dL (ref 30.0–36.0)
MCV: 92.7 fL (ref 80.0–100.0)
Platelets: 132 10*3/uL — ABNORMAL LOW (ref 150–400)
RBC: 2.73 MIL/uL — ABNORMAL LOW (ref 4.22–5.81)
RDW: 14.7 % (ref 11.5–15.5)
WBC: 10.3 10*3/uL (ref 4.0–10.5)
nRBC: 0.2 % (ref 0.0–0.2)

## 2019-11-29 LAB — RENAL FUNCTION PANEL
Albumin: 1.4 g/dL — ABNORMAL LOW (ref 3.5–5.0)
Anion gap: 11 (ref 5–15)
BUN: 16 mg/dL (ref 6–20)
CO2: 31 mmol/L (ref 22–32)
Calcium: 7.3 mg/dL — ABNORMAL LOW (ref 8.9–10.3)
Chloride: 96 mmol/L — ABNORMAL LOW (ref 98–111)
Creatinine, Ser: 0.81 mg/dL (ref 0.61–1.24)
GFR, Estimated: >60 mL/min (ref >60)
Glucose, Bld: 100 mg/dL — ABNORMAL HIGH (ref 70–99)
Phosphorus: 3.9 mg/dL (ref 2.5–4.6)
Potassium: 3.7 mmol/L (ref 3.5–5.1)
Sodium: 138 mmol/L (ref 135–145)

## 2019-11-29 LAB — MAGNESIUM: Magnesium: 2 mg/dL (ref 1.7–2.4)

## 2019-11-29 NOTE — Progress Notes (Addendum)
PROGRESS NOTE    Connor Roach  BMW:[id number] DOB: 1960-07-04 DOA: 10/30/2019  Brief Narrative:  Connor Roach is an 59 y.o. male who [facility].  He has a medical history significant of bipolar disorder, polysubstance abuse.  He was initially admitted to the acute hospital on 10/13/2019 due to encephalopathy secondary to medication toxicity.  He was found to have acute respiratory failure with hypercapnia.  He was initially unresponsive and had to be intubated.  Patient at that time also had elevated WBC count of 26.9 and creatinine of 1.52.  Urine toxicology screen was positive for cocaine and benzodiazepines.  He was treated for possible sepsis, respiratory failure.  Per records from the outside facility he developed angioedema which was treated and he also had paroxysmal atrial fibrillation and was treated.  Patient's hospital course complicated by seizures and developed on 10/16/2019 likely secondary to withdrawal.  He was placed on seizure medications.  He had multiple electrolyte abnormalities that were corrected.  He had tracheostomy as well as PEG tube placement.  After admission here he was found to have fever and leukocytosis.  He was started on empiric IV vancomycin, cefepime. Patient also was found to have leakage through his PEG tube?  CT of the abdomen and pelvis without contrast was done which showed that the gastrostomy tube pulled back into the abdominal wall.  No bowel obstruction.  Bibasilar atelectasis was noted.  Patient noted to have fever again with T-max 102.  Blood cultures from 11/20/2019 did not show any growth.  However, sputum cultures from 11/20/2019 showed methicillin-resistant staph aureus.  Few stenotrophomonas.  He is started back on vancomycin.  However, he continued to have fevers therefore started on empiric fluconazole.  He previously was on Levaquin but continued to have fevers while on the Levaquin. Bactrim for the stenotrophomonas.  Blood  cultures from 11/20/2019 showed staph capitis.  Repeat cultures from 11/25/2019 did not show any growth.  Assessment & Plan: Acute hypoxemic/hypercapnic respiratory failure, ventilator dependent  Pneumonia with MRSA, stenotrophomonas Systemic inflammatory response syndrome Fever PEG tube site infection Atrial fibrillation Encephalopathy Seizure Dysphagia Polysubstance abuse Sacrococcygeal deep tissue pressure injury/left ear pressure ulcer unspecified stage  Acute hypoxemic/hypercapnic respiratory failure: He initially was found unresponsive probably from polysubstance abuse/intoxication and had to be intubated for respiratory protection. Pulmonary following. He is high risk for aspiration and aspiration pneumonia.  Respiratory cultures showed MRSA, mild growth of stenotrophomonas.  He previously was started on empiric IV vancomycin, cefepime for fevers. Given concern for seizure would recommend to discontinue cefepime.  Remains on IV vancomycin.  He was also treated with Levaquin. However, continued to have fevers therefore Bactrim added to cover for the stenotrophomonas.  He is on IV Bactrim because of concern for ileus and absorption of the antibiotic.  Would recommend to switch to p.o. preferably in the next day or 2 to complete the treatment course for 7 days. -He is having increased secretions but otherwise as per status appears to be stable at this time.  If his respiratory status worsens or not improving would recommend repeat chest imaging preferably chest CT to better evaluate which could be done without contrast if concern for renal compromise.  Pneumonia with MRSA, stenotrophomonas: On IV vancomycin.  Plan to treat for a duration of 10 days pending improvement.  He was also treated with Levaquin but continued to have fevers while on the Levaquin therefore started on Bactrim.  Please monitor BUN/creatinine closely while on antibiotics.  Systemic inflammatory response syndrome: He  received empiric antibiotic treatment with IV vancomycin, cefepime, Flagyl for anaerobic coverage. However, after the antibiotics stopped he started having fevers again.  In the interim he had PEG tube site infection.  CT imaging showed PEG tube was displaced into the abdominal wall.  PEG tube removed.  He has some dark/black-colored material draining from around his PEG tube.  -He is currently on IV vancomycin, fluconazole started for ongoing fevers for systemic inflammatory response syndrome.  For the stenotrophomonas he was on Levaquin but continued to have fevers while on the Levaquin therefore switched to Bactrim.  Currently on IV Bactrim due to concern for ileus and absorption of the antibiotic.  Recommend to switch to p.o. in the next day or 2 as tolerated to complete 7 days.  He also had acute renal failure at the outside hospital likely secondary to sepsis.  Please monitor BUN/creatinine closely while on antibiotics.  Fever: Respiratory culture showing MRSA, mild stenotrophomonas.  Antibiotics as mentioned above.  He had previous blood cultures that showed staph S likely contaminant.  Repeat blood cultures blood cultures did not show any growth. Continue to monitor closely.  PEG tube site infection: Antibiotics as mentioned above.  PEG tube has been removed.  Continue local wound care.  Atrial fibrillation: Continue medication and management per the primary team.  Seizure: There is a mention of seizures at the outside facility could be possibly secondary to withdrawal. However, antibiotics lower the seizure threshold.  Patient got EEG which showed encephalopathy like picture.   Further management per primary team.  Encephalopathy: Continue supportive management per the primary team.  Dysphagia: Due to his dysphagia he is high risk for aspiration and aspiration pneumonia despite being on antibiotics.  Polysubstance abuse: He is at risk for bacteremia/endocarditis due to his drug abuse.   Antibiotics as mentioned above.  Blood cultures do not show any growth. Continue supportive management per the primary team.  Sacrococcygeal deep tissue pressure injury/left ear pressure ulcer unspecified stage: Continue local wound care.  Electrolyte abnormalities with hypokalemia: Continue to monitor, replacement per the primary team.  Due to his complex medical problems he is high risk for worsening and decompensation. Plan of care discussed with the primary team and pharmacy.   Subjective: He is opening eyes but not following commands.  Continues to remain encephalopathic.    Objective: Vitals: Temperature 97.1, pulse 66, respiratory rate 20, blood pressure 118/76, pulse oximetry 98%  Examination: General exam: Ill-appearing male, opening eyes but not following commands at this time. Remains encephalopathic. Eyes: PERLA, EOMI  ENMT: external ears and nose appear normal, poor dentition Neck: Has trach in place CVS: S1-S2 Respiratory:  Occasional rhonchi, no wheezing Abdomen: Soft, mild distention, diminished bowel sounds, PEG tube removed, site with dressing Musculoskeletal: edema Neuro: He is not following commands.  Unable to do neurologic exam at this time. Psych: Unable to assess Skin: no rashes   Data Reviewed: I have personally reviewed following labs and imaging studies  CBC: Recent Labs  Lab 11/24/19 0601 11/27/19 0656 11/29/19 0819  WBC 7.4 8.5 10.3  HGB 8.6* 7.5* 7.7*  HCT 27.9* 24.3* 25.3*  MCV 92.4 95.7 92.7  PLT 124* 142* 132*    Basic Metabolic Panel: Recent Labs  Lab 11/23/19 0438 11/23/19 0438 11/24/19 0601 11/25/19 0624 11/27/19 0656 11/28/19 0452 11/29/19 0819  NA 139  --  141 141 144  --  138  K 3.7  --  3.5 3.6 3.6  --  3.7  CL 97*  --  98 97* 99  --  96*  CO2 33*  --  35* 37* 36*  --  31  GLUCOSE 117*  --  116* 109* 114*  --  100*  BUN 20  --  --  16  CREATININE 0.66  --  0.62 0.63 0.84  --  0.81  CALCIUM 7.5*  --  7.6*  7.8* 7.6*  --  7.3*  MG 2.1   < > 2.1 1.9 2.0 2.0 2.0  PHOS  --   --   --  3.0 3.9 3.8 3.9   < > = values in this interval not displayed.    GFR: CrCl cannot be calculated (Unknown ideal weight.).  Liver Function Tests: Recent Labs  Lab 11/29/19 0819  ALBUMIN 1.4*    CBG: No results for input(s): GLUCAP in the last 168 hours.   Recent Results (from the past 240 hour(s))  Culture, Urine     Status: None   Collection Time: 11/20/19  8:39 AM   Specimen: Urine, Catheterized  Result Value Ref Range Status   Specimen Description URINE, CATHETERIZED  Final   Special Requests Normal  Final   Culture   Final    NO GROWTH Performed at [facility], [address] [place], [place] [address]    Report Status 11/21/2019 FINAL  Final  Expectorated sputum assessment w rflx to resp cult     Status: None   Collection Time: 11/20/19  8:45 AM   Specimen: Expectorated Sputum  Result Value Ref Range Status   Specimen Description Expect. Sput  Final   Special Requests Normal  Final   Sputum evaluation   Final    THIS SPECIMEN IS ACCEPTABLE FOR SPUTUM CULTURE Performed at [facility], [address] [place], [place] [address]    Report Status 11/20/2019 FINAL  Final  Culture, respiratory     Status: None   Collection Time: 11/20/19  8:45 AM  Result Value Ref Range Status   Specimen Description Expect. Sput  Final   Special Requests Normal Reflexed from [id number]  Final   Gram Stain   Final    ABUNDANT WBC PRESENT, PREDOMINANTLY PMN ABUNDANT GRAM POSITIVE COCCI ABUNDANT GRAM NEGATIVE RODS Performed at [facility], [address], [place], [place] [address]    Culture   Final    ABUNDANT METHICILLIN RESISTANT STAPHYLOCOCCUS AUREUS FEW STENOTROPHOMONAS MALTOPHILIA    Report Status 11/23/2019 FINAL  Final   Organism ID, Bacteria METHICILLIN RESISTANT STAPHYLOCOCCUS AUREUS  Final   Organism ID, Bacteria STENOTROPHOMONAS MALTOPHILIA  Final      Susceptibility    Methicillin resistant staphylococcus aureus - MIC*    CIPROFLOXACIN >=8 RESISTANT Resistant     ERYTHROMYCIN >=8 RESISTANT Resistant     GENTAMICIN >=16 RESISTANT Resistant     OXACILLIN >=4 RESISTANT Resistant     TETRACYCLINE <=1 SENSITIVE Sensitive     VANCOMYCIN <=0.5 SENSITIVE Sensitive     TRIMETH/SULFA <=10 SENSITIVE Sensitive     CLINDAMYCIN >=8 RESISTANT Resistant     RIFAMPIN <=0.5 SENSITIVE Sensitive     Inducible Clindamycin NEGATIVE Sensitive     * ABUNDANT METHICILLIN RESISTANT STAPHYLOCOCCUS AUREUS   Stenotrophomonas maltophilia - MIC*    LEVOFLOXACIN 0.25 SENSITIVE Sensitive     TRIMETH/SULFA <=20 SENSITIVE Sensitive     * FEW STENOTROPHOMONAS MALTOPHILIA  Culture, blood (routine x 2)     Status: None  Collection Time: 11/20/19 10:20 AM   Specimen: BLOOD RIGHT HAND  Result Value Ref Range Status   Specimen Description BLOOD RIGHT HAND  Final   Special Requests   Final    BOTTLES DRAWN AEROBIC ONLY Blood Culture adequate volume   Culture   Final    NO GROWTH 5 DAYS Performed at [facility], [address] [place], [place] [address]    Report Status 11/25/2019 FINAL  Final  Culture, blood (routine x 2)     Status: Abnormal   Collection Time: 11/20/19 10:26 AM   Specimen: BLOOD  Result Value Ref Range Status   Specimen Description BLOOD THUMB  Final   Special Requests   Final    BOTTLES DRAWN AEROBIC ONLY Blood Culture adequate volume   Culture  Setup Time   Final    GRAM POSITIVE COCCI AEROBIC BOTTLE ONLY CRITICAL RESULT CALLED TO, READ BACK BY AND VERIFIED WITH: J BUSA RN 11/22/19 0124 JDW    Culture (A)  Final    STAPHYLOCOCCUS CAPITIS THE SIGNIFICANCE OF ISOLATING THIS ORGANISM FROM A SINGLE SET OF BLOOD CULTURES WHEN MULTIPLE SETS ARE DRAWN IS UNCERTAIN. PLEASE NOTIFY THE MICROBIOLOGY DEPARTMENT WITHIN ONE WEEK IF SPECIATION AND SENSITIVITIES ARE REQUIRED. Performed at [facility], [address] [place], [place] [address]    Report  Status 11/23/2019 FINAL  Final  Blood Culture ID Panel (Reflexed)     Status: Abnormal   Collection Time: 11/20/19 10:26 AM  Result Value Ref Range Status   Enterococcus faecalis NOT DETECTED NOT DETECTED Final   Enterococcus Faecium NOT DETECTED NOT DETECTED Final   Listeria monocytogenes NOT DETECTED NOT DETECTED Final   Staphylococcus species DETECTED (A) NOT DETECTED Final    Comment: CRITICAL RESULT CALLED TO, READ BACK BY AND VERIFIED WITH: J BUSA RN 11/22/19 0124 JDW    Staphylococcus aureus (BCID) NOT DETECTED NOT DETECTED Final   Staphylococcus epidermidis NOT DETECTED NOT DETECTED Final   Staphylococcus lugdunensis NOT DETECTED NOT DETECTED Final   Streptococcus species NOT DETECTED NOT DETECTED Final   Streptococcus agalactiae NOT DETECTED NOT DETECTED Final   Streptococcus pneumoniae NOT DETECTED NOT DETECTED Final   Streptococcus pyogenes NOT DETECTED NOT DETECTED Final   A.calcoaceticus-baumannii NOT DETECTED NOT DETECTED Final   Bacteroides fragilis NOT DETECTED NOT DETECTED Final   Enterobacterales NOT DETECTED NOT DETECTED Final   Enterobacter cloacae complex NOT DETECTED NOT DETECTED Final   Escherichia coli NOT DETECTED NOT DETECTED Final   Klebsiella aerogenes NOT DETECTED NOT DETECTED Final   Klebsiella oxytoca NOT DETECTED NOT DETECTED Final   Klebsiella pneumoniae NOT DETECTED NOT DETECTED Final   Proteus species NOT DETECTED NOT DETECTED Final   Salmonella species NOT DETECTED NOT DETECTED Final   Serratia marcescens NOT DETECTED NOT DETECTED Final   Haemophilus influenzae NOT DETECTED NOT DETECTED Final   Neisseria meningitidis NOT DETECTED NOT DETECTED Final   Pseudomonas aeruginosa NOT DETECTED NOT DETECTED Final   Stenotrophomonas maltophilia NOT DETECTED NOT DETECTED Final   Candida albicans NOT DETECTED NOT DETECTED Final   Candida auris NOT DETECTED NOT DETECTED Final   Candida glabrata NOT DETECTED NOT DETECTED Final   Candida krusei NOT DETECTED NOT  DETECTED Final   Candida parapsilosis NOT DETECTED NOT DETECTED Final   Candida tropicalis NOT DETECTED NOT DETECTED Final   Cryptococcus neoformans/gattii NOT DETECTED NOT DETECTED Final    Comment: Performed at [facility], [address] [place], [place] [address]  Culture,  blood (routine x 2)     Status: None (Preliminary result)   Collection Time: 11/25/19  2:03 PM   Specimen: BLOOD RIGHT HAND  Result Value Ref Range Status   Specimen Description BLOOD RIGHT HAND  Final   Special Requests   Final    BOTTLES DRAWN AEROBIC AND ANAEROBIC Blood Culture results may not be optimal due to an inadequate volume of blood received in culture bottles   Culture   Final    NO GROWTH 4 DAYS Performed at [facility], [address] [place], [place] [address]    Report Status PENDING  Incomplete  Culture, blood (routine x 2)     Status: None (Preliminary result)   Collection Time: 11/25/19  2:08 PM   Specimen: BLOOD RIGHT HAND  Result Value Ref Range Status   Specimen Description BLOOD RIGHT HAND  Final   Special Requests   Final    AEROBIC BOTTLE ONLY Blood Culture results may not be optimal due to an inadequate volume of blood received in culture bottles   Culture   Final    NO GROWTH 4 DAYS Performed at [facility], [address] [place], [place] [address]    Report Status PENDING  Incomplete       Radiology Studies: DG Abd 1 View  Result Date: 11/28/2019 CLINICAL DATA:  59 year old male NG tube placement. EXAM: ABDOMEN - 1 VIEW COMPARISON:  11/27/2019 and earlier. FINDINGS: Portable AP supine view at 1930 hours. The patient is rotated to the right. Enteric tube courses into the abdomen, and the side hole continues to project near the midline epigastrium as before. Negative visible bowel gas pattern. Continued streaky opacity at the lung bases. No acute osseous abnormality identified. IMPRESSION: Enteric tube placed into the stomach, side hole the level of the  gastric body. Electronically Signed   By: Odessa Fleming M.D.   On: 11/28/2019 19:41    Scheduled Meds: Please see MAR   Vonzella Nipple, MD  11/29/2019, 4:24 PM

## 2019-11-29 NOTE — Progress Notes (Addendum)
Pulmonary Critical Care Medicine [facility]   PULMONARY CRITICAL CARE SERVICE  PROGRESS NOTE  Date of Service: 11/29/2019  Connor Roach  TML:[id number]  DOB: 1960-04-27   DOA: 10/30/2019  Referring Physician: Carron Curie, MD  HPI: Connor Roach is a 59 y.o. male seen for follow up of Acute on Chronic Respiratory Failure.  Patient remains on aerosol trach collar 35% FiO2 satting well no fever or distress.  Medications: Reviewed on Rounds  Physical Exam:  Vitals: Pulse 83 respirations 20 BP 133/59 O2 sat 98% temp 97.0  Ventilator Settings not currently on ventilator  . General: Comfortable at this time . Eyes: Grossly normal lids, irises & conjunctiva . ENT: grossly tongue is normal . Neck: no obvious mass . Cardiovascular: S1 S2 normal no gallop . Respiratory: No rales or rhonchi noted . Abdomen: soft . Skin: no rash seen on limited exam . Musculoskeletal: not rigid . Psychiatric:unable to assess . Neurologic: no seizure no involuntary movements         Lab Data:   Basic Metabolic Panel: Recent Labs  Lab 11/23/19 0438 11/23/19 0438 11/24/19 0601 11/25/19 0624 11/27/19 0656 11/28/19 0452 11/29/19 0819  NA 139  --  141 141 144  --  138  K 3.7  --  3.5 3.6 3.6  --  3.7  CL 97*  --  98 97* 99  --  96*  CO2 33*  --  35* 37* 36*  --  31  GLUCOSE 117*  --  116* 109* 114*  --  100*  BUN 20  --  20 18 15   --  16  CREATININE 0.66  --  0.62 0.63 0.84  --  0.81  CALCIUM 7.5*  --  7.6* 7.8* 7.6*  --  7.3*  MG 2.1   < > 2.1 1.9 2.0 2.0 2.0  PHOS  --   --   --  3.0 3.9 3.8 3.9   < > = values in this interval not displayed.    ABG: No results for input(s): PHART, PCO2ART, PO2ART, HCO3, O2SAT in the last 168 hours.  Liver Function Tests: Recent Labs  Lab 11/29/19 0819  ALBUMIN 1.4*   No results for input(s): LIPASE, AMYLASE in the last 168 hours. Recent Labs  Lab 11/24/19 0601  AMMONIA 55*    CBC: Recent Labs  Lab 11/24/19 0601  11/27/19 0656 11/29/19 0819  WBC 7.4 8.5 10.3  HGB 8.6* 7.5* 7.7*  HCT 27.9* 24.3* 25.3*  MCV 92.4 95.7 92.7  PLT 124* 142* 132*    Cardiac Enzymes: No results for input(s): CKTOTAL, CKMB, CKMBINDEX, TROPONINI in the last 168 hours.  BNP (last 3 results) No results for input(s): BNP in the last 8760 hours.  ProBNP (last 3 results) No results for input(s): PROBNP in the last 8760 hours.  Radiological Exams: DG Abd 1 View  Result Date: 11/28/2019 CLINICAL DATA:  59 year old male NG tube placement. EXAM: ABDOMEN - 1 VIEW COMPARISON:  11/27/2019 and earlier. FINDINGS: Portable AP supine view at 1930 hours. The patient is rotated to the right. Enteric tube courses into the abdomen, and the side hole continues to project near the midline epigastrium as before. Negative visible bowel gas pattern. Continued streaky opacity at the lung bases. No acute osseous abnormality identified. IMPRESSION: Enteric tube placed into the stomach, side hole the level of the gastric body. Electronically Signed   By: 01/27/2020 M.D.   On: 11/28/2019 19:41    Assessment/Plan Active  Problems:   Acute on chronic respiratory failure with hypoxia (HCC)   Chronic atrial fibrillation (HCC)   Seizure disorder (HCC)   Metabolic encephalopathy   COPD, severe (HCC)   1. Acute on chronic respiratory failure hypoxiapatient continues on aerosol trach collar 35% at this time satting well no fever or distress.  Has good control of secretions at this time we will continue supportive measures and pulmonary toilet. 2. Chronic atrial fibrillation rate is controlled continue to monitor 3. Seizure disorder no active seizures noted at this time 4. Metabolic encephalopathy supportive care slow improvement 5. Severe COPD medical management   I have personally seen and evaluated the patient, evaluated laboratory and imaging results, formulated the assessment and plan and placed orders. The Patient requires high complexity  decision making with multiple systems involvement.  Rounds were done with the Respiratory Therapy Director and Staff therapists and discussed with nursing staff also.  Yevonne Pax, MD [facility] Pulmonary Critical Care Medicine Sleep Medicine

## 2019-11-30 ENCOUNTER — Other Ambulatory Visit (HOSPITAL_COMMUNITY): Payer: BC Managed Care – PPO

## 2019-11-30 DIAGNOSIS — I482 Chronic atrial fibrillation, unspecified: Secondary | ICD-10-CM | POA: Diagnosis not present

## 2019-11-30 DIAGNOSIS — J449 Chronic obstructive pulmonary disease, unspecified: Secondary | ICD-10-CM | POA: Diagnosis not present

## 2019-11-30 DIAGNOSIS — G9341 Metabolic encephalopathy: Secondary | ICD-10-CM | POA: Diagnosis not present

## 2019-11-30 DIAGNOSIS — J9621 Acute and chronic respiratory failure with hypoxia: Secondary | ICD-10-CM | POA: Diagnosis not present

## 2019-11-30 LAB — BLOOD GAS, ARTERIAL
Acid-Base Excess: 9.6 mmol/L — ABNORMAL HIGH (ref 0.0–2.0)
Acid-Base Excess: 10.3 mmol/L — ABNORMAL HIGH (ref 0.0–2.0)
Bicarbonate: 34.9 mmol/L — ABNORMAL HIGH (ref 20.0–28.0)
Bicarbonate: 35.1 mmol/L — ABNORMAL HIGH (ref 20.0–28.0)
FIO2: 98
FIO2: 50
O2 Saturation: 84 %
O2 Saturation: 85.5 %
Patient temperature: 36.6
Patient temperature: 37
pCO2 arterial: 59 mmHg — ABNORMAL HIGH (ref 32.0–48.0)
pCO2 arterial: 53.9 mmHg — ABNORMAL HIGH (ref 32.0–48.0)
pH, Arterial: 7.388 (ref 7.350–7.450)
pH, Arterial: 7.429 (ref 7.350–7.450)
pO2, Arterial: 53.3 mmHg — ABNORMAL LOW (ref 83.0–108.0)
pO2, Arterial: 53.6 mmHg — ABNORMAL LOW (ref 83.0–108.0)

## 2019-11-30 LAB — CULTURE, BLOOD (ROUTINE X 2)
Culture: NO GROWTH
Culture: NO GROWTH

## 2019-11-30 NOTE — Progress Notes (Addendum)
Pulmonary Critical Care Medicine [facility] [place]   PULMONARY CRITICAL CARE SERVICE  PROGRESS NOTE  Date of Service: 11/30/2019  Connor Roach  FTD:[id number]  DOB: 06/01/1960   DOA: 10/30/2019  Referring Physician: Carron Curie, MD  HPI: Connor Roach is a 59 y.o. male seen for follow up of Acute on Chronic Respiratory Failure.  Patient was placed back on ventilator overnight currently on full support with rate 16 and FiO2 of 50% satting well at this time no distress.  Medications: Reviewed on Rounds  Physical Exam:  Vitals: Pulse 77 respirations 24 BP 104/55 O2 sat 90% temp 97.0  Ventilator Settings ventilator mode AC PC rate of 16 tidal volume 400 PEEP of 5 and FiO2 of 50%  . General: Comfortable at this time . Eyes: Grossly normal lids, irises & conjunctiva . ENT: grossly tongue is normal . Neck: no obvious mass . Cardiovascular: S1 S2 normal no gallop . Respiratory: Coarse breath sounds . Abdomen: soft . Skin: no rash seen on limited exam . Musculoskeletal: not rigid . Psychiatric:unable to assess . Neurologic: no seizure no involuntary movements         Lab Data:   Basic Metabolic Panel: Recent Labs  Lab 11/24/19 0601 11/25/19 0624 11/27/19 0656 11/28/19 0452 11/29/19 0819  NA 141 141 144  --  138  K 3.5 3.6 3.6  --  3.7  CL 98 97* 99  --  96*  CO2 35* 37* 36*  --  31  GLUCOSE 116* 109* 114*  --  100*  BUN 20 18 15   --  16  CREATININE 0.62 0.63 0.84  --  0.81  CALCIUM 7.6* 7.8* 7.6*  --  7.3*  MG 2.1 1.9 2.0 2.0 2.0  PHOS  --  3.0 3.9 3.8 3.9    ABG: Recent Labs  Lab 11/29/19 1835 11/29/19 2224 11/30/19 0348 11/30/19 0920  PHART 7.473* 7.451* 7.388 7.429  PCO2ART 49.4* 51.8* 59.0* 53.9*  PO2ART 45.5* 48.3* 53.3* 53.6*  HCO3 35.8* 35.7* 34.9* 35.1*  O2SAT 80.2 81.6 84.0 85.5    Liver Function Tests: Recent Labs  Lab 11/29/19 0819  ALBUMIN 1.4*   No results for input(s): LIPASE, AMYLASE in the last 168 hours. Recent  Labs  Lab 11/24/19 0601  AMMONIA 55*    CBC: Recent Labs  Lab 11/24/19 0601 11/27/19 0656 11/29/19 0819  WBC 7.4 8.5 10.3  HGB 8.6* 7.5* 7.7*  HCT 27.9* 24.3* 25.3*  MCV 92.4 95.7 92.7  PLT 124* 142* 132*    Cardiac Enzymes: No results for input(s): CKTOTAL, CKMB, CKMBINDEX, TROPONINI in the last 168 hours.  BNP (last 3 results) No results for input(s): BNP in the last 8760 hours.  ProBNP (last 3 results) No results for input(s): PROBNP in the last 8760 hours.  Radiological Exams: DG Abd 1 View  Result Date: 11/28/2019 CLINICAL DATA:  59 year old male NG tube placement. EXAM: ABDOMEN - 1 VIEW COMPARISON:  11/27/2019 and earlier. FINDINGS: Portable AP supine view at 1930 hours. The patient is rotated to the right. Enteric tube courses into the abdomen, and the side hole continues to project near the midline epigastrium as before. Negative visible bowel gas pattern. Continued streaky opacity at the lung bases. No acute osseous abnormality identified. IMPRESSION: Enteric tube placed into the stomach, side hole the level of the gastric body. Electronically Signed   By: 01/27/2020 M.D.   On: 11/28/2019 19:41   DG CHEST PORT 1 VIEW  Result Date: 11/30/2019  CLINICAL DATA:  Respiratory distress. EXAM: PORTABLE CHEST 1 VIEW COMPARISON:  One-view chest x-ray 11/26/2019 FINDINGS: Heart is enlarged. Tracheostomy tube is stable. Enteric tube courses off the inferior border of the film. Bilateral pleural effusions are present, right greater than left. Interstitial and airspace opacities are increasing, right greater than left. IMPRESSION: Increasing interstitial and airspace disease, right greater than left. This is concerning for congestive heart failure versus ARDS. Electronically Signed   By: Marin Roberts M.D.   On: 11/30/2019 05:59    Assessment/Plan Active Problems:   Acute on chronic respiratory failure with hypoxia (HCC)   Chronic atrial fibrillation (HCC)   Seizure disorder  (HCC)   Metabolic encephalopathy   COPD, severe (HCC)   1. Acute on chronic respiratory failure hypoxiapatient continues to sat well this time however was placed back on the vent overnight on full support pressure control mode rate of 16 tidal volume 400 PEEP of 5 and FiO2 of 50% due to desaturation.  We will continue to wean back to aerosol trach collar as before.  We will continue aggressive pulmonary toilet supportive measures. 2. Chronic atrial fibrillation rate is controlled continue to monitor 3. Seizure disorder no active seizures noted at this time 4. Metabolic encephalopathy supportive care slow improvement 5. Severe COPD medical management   I have personally seen and evaluated the patient, evaluated laboratory and imaging results, formulated the assessment and plan and placed orders. The Patient requires high complexity decision making with multiple systems involvement.  Rounds were done with the Respiratory Therapy Director and Staff therapists and discussed with nursing staff also.  Yevonne Pax, MD [facility] Pulmonary Critical Care Medicine Sleep Medicine

## 2019-11-30 NOTE — Progress Notes (Signed)
Referring Physician(s): Dr. Sharyon Medicus  Supervising Physician: Dr. Elby Showers  Patient Status:  [facility] - In-pt  Chief Complaint: Dysphagia Gastrostomy tube malposition, s/p removal 9/20  Subjective: Patient non-verbal, non-interactive. Opens eyes with stimulation.  NGT in place connected to ILWS Gastrostomy site with open stoma, preserved with small, thin tubing in tract.  Colostomy bag around insertion site with trace amount of gastric fluid.   Dressings in place to facilitate wound healing.  Allergies: Patient has no allergy information on record.  Medications: Prior to Admission medications   Not on File     Vital Signs: There were no vitals taken for this visit.  Physical Exam  NAD, trach/vent, NGT in place Abdomen: Gastrostomy  Site with open stoma, preserved with small, thin tubing in tract.  Colostomy bag around insertion site with trace amount of gastric fluid.    Imaging: DG Abd 1 View  Result Date: 11/28/2019 CLINICAL DATA:  59 year old male NG tube placement. EXAM: ABDOMEN - 1 VIEW COMPARISON:  11/27/2019 and earlier. FINDINGS: Portable AP supine view at 1930 hours. The patient is rotated to the right. Enteric tube courses into the abdomen, and the side hole continues to project near the midline epigastrium as before. Negative visible bowel gas pattern. Continued streaky opacity at the lung bases. No acute osseous abnormality identified. IMPRESSION: Enteric tube placed into the stomach, side hole the level of the gastric body. Electronically Signed   By: Odessa Fleming M.D.   On: 11/28/2019 19:41   DG Abd 1 View  Result Date: 11/27/2019 CLINICAL DATA:  Ileus. EXAM: ABDOMEN - 1 VIEW COMPARISON:  Single-view of the abdomen 11/25/2019 and 11/23/2019. FINDINGS: NG tube remains in place in good position. Gaseous distention of the colon persists and has worsened in the ascending colon. No small bowel dilatation. No abnormal abdominal calcification or focal bony abnormality. Rectal  temperature probe noted. IMPRESSION: Increased gaseous distention of the ascending colon. Negative for small bowel obstruction. NG tube in good position. Electronically Signed   By: Drusilla Kanner M.D.   On: 11/27/2019 07:44   DG CHEST PORT 1 VIEW  Result Date: 11/30/2019 CLINICAL DATA:  Respiratory distress. EXAM: PORTABLE CHEST 1 VIEW COMPARISON:  One-view chest x-ray 11/26/2019 FINDINGS: Heart is enlarged. Tracheostomy tube is stable. Enteric tube courses off the inferior border of the film. Bilateral pleural effusions are present, right greater than left. Interstitial and airspace opacities are increasing, right greater than left. IMPRESSION: Increasing interstitial and airspace disease, right greater than left. This is concerning for congestive heart failure versus ARDS. Electronically Signed   By: Marin Roberts M.D.   On: 11/30/2019 05:59    Labs:  CBC: Recent Labs    11/21/19 1113 11/24/19 0601 11/27/19 0656 11/29/19 0819  WBC 7.9 7.4 8.5 10.3  HGB 9.7* 8.6* 7.5* 7.7*  HCT 30.4* 27.9* 24.3* 25.3*  PLT 191 124* 142* 132*    COAGS: Recent Labs    10/31/19 1115  INR 1.2  APTT 34    BMP: Recent Labs    11/16/19 0718 11/16/19 0718 11/17/19 0601 11/17/19 0601 11/19/19 8341 11/19/19 9622 11/20/19 0641 11/21/19 1113 11/24/19 0601 11/25/19 0624 11/27/19 0656 11/29/19 0819  NA 143   < > 139   < > 142   < > 142   < > 141 141 144 138  K 3.2*   < > 3.7   < > 3.4*   < > 3.2*   < > 3.5 3.6 3.6 3.7  CL 111   < >  106   < > 107   < > 101   < > 98 97* 99 96*  CO2 26   < > 23   < > 29   < > 32   < > 35* 37* 36* 31  GLUCOSE 121*   < > 133*   < > 112*   < > 122*   < > 116* 109* 114* 100*  BUN 23*   < > 22*   < > 23*   < > 23*   < > 20 18 15 16   CALCIUM 7.6*   < > 7.5*   < > 7.8*   < > 7.6*   < > 7.6* 7.8* 7.6* 7.3*  CREATININE 0.90   < > 0.91   < > 0.88   < > 0.92   < > 0.62 0.63 0.84 0.81  GFRNONAA >60   < > >60   < > >60   < > >60   < > >60 >60 >60 >60  GFRAA >60   --  >60  --  >60  --  >60  --   --   --   --   --    < > = values in this interval not displayed.    LIVER FUNCTION TESTS: Recent Labs    10/31/19 1115 11/01/19 0720 11/12/19 1115 11/13/19 0956 11/15/19 1247 11/21/19 1113 11/22/19 0533 11/29/19 0819  BILITOT 0.5  --  0.3  --   --   --   --   --   AST 29  --  13*  --   --   --   --   --   ALT 34  --  14  --   --   --   --   --   ALKPHOS 63  --  36*  --   --   --   --   --   PROT 5.8*  --  4.2*  --   --   --   --   --   ALBUMIN 1.9*   < > 1.0*   < > 1.2* 1.4* 1.4* 1.4*   < > = values in this interval not displayed.    Assessment and Plan: Dysphagia, gastrostomy tube malposition Patient underwent gastrostomy tube placement at OSH prior to arrival to [facility].  Tube was later found to be dislodged and removed 11/05/19.  Request now made for replacement.  Patient assessed at bedside today.  His prior tract has a small, thin plastic tube in place with colostomy bag to limit skin breakdown.  This is clearly open and preserved although has likely closed quite a bit.  Skin lateral to the prior skin in various stages of healing.  Plan previously made to allow tract and skin to heal prior to replacement of gastrostomy tube.   Will need to reconsider options for replacement based on the degree of skin breakdown previously described.  Unsure if current tract can be used or if complete healing should be promoted prior to replacement.  Discussed with 11/07/19, NP.  Will discuss with IR MD and follow.  May need to consider repeat imaging.  Electronically Signed: Waylan Boga, PA 11/30/2019, 4:10 PM   I spent a total of 15 Minutes at the the patient's bedside AND on the patient's hospital floor or unit, greater than 50% of which was counseling/coordinating care for dysphagia.

## 2019-12-01 ENCOUNTER — Other Ambulatory Visit (HOSPITAL_COMMUNITY): Payer: BC Managed Care – PPO

## 2019-12-01 DIAGNOSIS — J9621 Acute and chronic respiratory failure with hypoxia: Secondary | ICD-10-CM | POA: Diagnosis not present

## 2019-12-01 DIAGNOSIS — G9341 Metabolic encephalopathy: Secondary | ICD-10-CM | POA: Diagnosis not present

## 2019-12-01 DIAGNOSIS — I482 Chronic atrial fibrillation, unspecified: Secondary | ICD-10-CM | POA: Diagnosis not present

## 2019-12-01 DIAGNOSIS — J449 Chronic obstructive pulmonary disease, unspecified: Secondary | ICD-10-CM | POA: Diagnosis not present

## 2019-12-01 LAB — CBC
HCT: 22 % — ABNORMAL LOW (ref 39.0–52.0)
HCT: 25.7 % — ABNORMAL LOW (ref 39.0–52.0)
Hemoglobin: 7.2 g/dL — ABNORMAL LOW (ref 13.0–17.0)
Hemoglobin: 7.7 g/dL — ABNORMAL LOW (ref 13.0–17.0)
MCH: 29.5 pg (ref 26.0–34.0)
MCH: 28 pg (ref 26.0–34.0)
MCHC: 32.7 g/dL (ref 30.0–36.0)
MCHC: 30 g/dL (ref 30.0–36.0)
MCV: 90.2 fL (ref 80.0–100.0)
MCV: 93.5 fL (ref 80.0–100.0)
Platelets: 137 10*3/uL — ABNORMAL LOW (ref 150–400)
Platelets: 126 10*3/uL — ABNORMAL LOW (ref 150–400)
RBC: 2.44 MIL/uL — ABNORMAL LOW (ref 4.22–5.81)
RBC: 2.75 MIL/uL — ABNORMAL LOW (ref 4.22–5.81)
RDW: 14.9 % (ref 11.5–15.5)
RDW: 15.4 % (ref 11.5–15.5)
WBC: 10.1 10*3/uL (ref 4.0–10.5)
WBC: 9.8 10*3/uL (ref 4.0–10.5)
nRBC: 0 % (ref 0.0–0.2)
nRBC: 0 % (ref 0.0–0.2)

## 2019-12-01 LAB — RENAL FUNCTION PANEL
Albumin: 1.3 g/dL — ABNORMAL LOW (ref 3.5–5.0)
Anion gap: 10 (ref 5–15)
BUN: 14 mg/dL (ref 6–20)
CO2: 31 mmol/L (ref 22–32)
Calcium: 7.1 mg/dL — ABNORMAL LOW (ref 8.9–10.3)
Chloride: 93 mmol/L — ABNORMAL LOW (ref 98–111)
Creatinine, Ser: 1.02 mg/dL (ref 0.61–1.24)
GFR, Estimated: >60 mL/min (ref >60)
Glucose, Bld: 103 mg/dL — ABNORMAL HIGH (ref 70–99)
Phosphorus: 4.5 mg/dL (ref 2.5–4.6)
Potassium: 4.4 mmol/L (ref 3.5–5.1)
Sodium: 134 mmol/L — ABNORMAL LOW (ref 135–145)

## 2019-12-01 LAB — BLOOD GAS, ARTERIAL
Acid-Base Excess: 9.2 mmol/L — ABNORMAL HIGH (ref 0.0–2.0)
Bicarbonate: 33 mmol/L — ABNORMAL HIGH (ref 20.0–28.0)
FIO2: 60
O2 Saturation: 99.3 %
Patient temperature: 37.5
pCO2 arterial: 44.1 mmHg (ref 32.0–48.0)
pH, Arterial: 7.489 — ABNORMAL HIGH (ref 7.350–7.450)
pO2, Arterial: 161 mmHg — ABNORMAL HIGH (ref 83.0–108.0)

## 2019-12-01 LAB — URINALYSIS, ROUTINE W REFLEX MICROSCOPIC
Bilirubin Urine: NEGATIVE
Glucose, UA: NEGATIVE mg/dL
Ketones, ur: NEGATIVE mg/dL
Nitrite: NEGATIVE
Protein, ur: NEGATIVE mg/dL
Specific Gravity, Urine: 1.006 (ref 1.005–1.030)
pH: 6 (ref 5.0–8.0)

## 2019-12-01 LAB — MAGNESIUM: Magnesium: 1.8 mg/dL (ref 1.7–2.4)

## 2019-12-01 NOTE — Progress Notes (Signed)
Pulmonary Critical Care Medicine [facility] [place]   PULMONARY CRITICAL CARE SERVICE  PROGRESS NOTE  Date of Service: 12/01/2019  Connor Roach  GQQ:[id number]  DOB: 03/29/1960   DOA: 10/30/2019  Referring Physician: Carron Curie, MD  HPI: Connor Roach is a 59 y.o. male seen for follow up of Acute on Chronic Respiratory Failure.  Apparently had a fever today temperature was up to one 1.2 right now is on the ventilator and full support  Medications: Reviewed on Rounds  Physical Exam:  Vitals: Temperature 100.2 pulse 84 respiratory was 15 blood pressure is 127/89 saturations 97%  Ventilator Settings on assist control FiO2 60%  . General: Comfortable at this time . Eyes: Grossly normal lids, irises & conjunctiva . ENT: grossly tongue is normal . Neck: no obvious mass . Cardiovascular: S1 S2 normal no gallop . Respiratory: Coarse breath sounds noted bilaterally . Abdomen: soft . Skin: no rash seen on limited exam . Musculoskeletal: not rigid . Psychiatric:unable to assess . Neurologic: no seizure no involuntary movements         Lab Data:   Basic Metabolic Panel: Recent Labs  Lab 11/25/19 0624 11/27/19 0656 11/28/19 0452 11/29/19 0819 12/01/19 0501  NA 141 144  --  138 134*  K 3.6 3.6  --  3.7 4.4  CL 97* 99  --  96* 93*  CO2 37* 36*  --  31 31  GLUCOSE 109* 114*  --  100* 103*  BUN 18 15  --  16 14  CREATININE 0.63 0.84  --  0.81 1.02  CALCIUM 7.8* 7.6*  --  7.3* 7.1*  MG 1.9 2.0 2.0 2.0 1.8  PHOS 3.0 3.9 3.8 3.9 4.5    ABG: Recent Labs  Lab 11/29/19 1835 11/29/19 2224 11/30/19 0348 11/30/19 0920  PHART 7.473* 7.451* 7.388 7.429  PCO2ART 49.4* 51.8* 59.0* 53.9*  PO2ART 45.5* 48.3* 53.3* 53.6*  HCO3 35.8* 35.7* 34.9* 35.1*  O2SAT 80.2 81.6 84.0 85.5    Liver Function Tests: Recent Labs  Lab 11/29/19 0819 12/01/19 0501  ALBUMIN 1.4* 1.3*   No results for input(s): LIPASE, AMYLASE in the last 168 hours. No results for  input(s): AMMONIA in the last 168 hours.  CBC: Recent Labs  Lab 11/27/19 0656 11/29/19 0819 12/01/19 0501  WBC 8.5 10.3 10.1  HGB 7.5* 7.7* 7.2*  HCT 24.3* 25.3* 22.0*  MCV 95.7 92.7 90.2  PLT 142* 132* 137*    Cardiac Enzymes: No results for input(s): CKTOTAL, CKMB, CKMBINDEX, TROPONINI in the last 168 hours.  BNP (last 3 results) No results for input(s): BNP in the last 8760 hours.  ProBNP (last 3 results) No results for input(s): PROBNP in the last 8760 hours.  Radiological Exams: DG CHEST PORT 1 VIEW  Result Date: 11/30/2019 CLINICAL DATA:  Respiratory distress. EXAM: PORTABLE CHEST 1 VIEW COMPARISON:  One-view chest x-ray 11/26/2019 FINDINGS: Heart is enlarged. Tracheostomy tube is stable. Enteric tube courses off the inferior border of the film. Bilateral pleural effusions are present, right greater than left. Interstitial and airspace opacities are increasing, right greater than left. IMPRESSION: Increasing interstitial and airspace disease, right greater than left. This is concerning for congestive heart failure versus ARDS. Electronically Signed   By: Marin Roberts M.D.   On: 11/30/2019 05:59    Assessment/Plan Active Problems:   Acute on chronic respiratory failure with hypoxia (HCC)   Chronic atrial fibrillation (HCC)   Seizure disorder (HCC)   Metabolic encephalopathy   COPD, severe (  HCC)   1. Acute on chronic respiratory failure hypoxia we will continue with full support on the ventilator right now patient has failure being worked up.  Chest x-ray shows some increasing interstitial airspace disease noted. 2. Chronic atrial fibrillation rate is controlled at this time 3. Seizure disorder no active seizures 4. Metabolic encephalopathy slow to improve 5. Severe COPD medical management   I have personally seen and evaluated the patient, evaluated laboratory and imaging results, formulated the assessment and plan and placed orders. The Patient requires  high complexity decision making with multiple systems involvement.  Rounds were done with the Respiratory Therapy Director and Staff therapists and discussed with nursing staff also.  Yevonne Pax, MD [facility] Pulmonary Critical Care Medicine Sleep Medicine

## 2019-12-02 DIAGNOSIS — I482 Chronic atrial fibrillation, unspecified: Secondary | ICD-10-CM | POA: Diagnosis not present

## 2019-12-02 DIAGNOSIS — J9621 Acute and chronic respiratory failure with hypoxia: Secondary | ICD-10-CM | POA: Diagnosis not present

## 2019-12-02 DIAGNOSIS — J449 Chronic obstructive pulmonary disease, unspecified: Secondary | ICD-10-CM | POA: Diagnosis not present

## 2019-12-02 DIAGNOSIS — G9341 Metabolic encephalopathy: Secondary | ICD-10-CM | POA: Diagnosis not present

## 2019-12-02 LAB — URINE CULTURE: Culture: <10000 — AB

## 2019-12-02 LAB — CBC
HCT: 25.6 % — ABNORMAL LOW (ref 39.0–52.0)
Hemoglobin: 8.1 g/dL — ABNORMAL LOW (ref 13.0–17.0)
MCH: 28.6 pg (ref 26.0–34.0)
MCHC: 31.6 g/dL (ref 30.0–36.0)
MCV: 90.5 fL (ref 80.0–100.0)
Platelets: 141 10*3/uL — ABNORMAL LOW (ref 150–400)
RBC: 2.83 MIL/uL — ABNORMAL LOW (ref 4.22–5.81)
RDW: 15 % (ref 11.5–15.5)
WBC: 10.3 10*3/uL (ref 4.0–10.5)
nRBC: 0 % (ref 0.0–0.2)

## 2019-12-02 LAB — RENAL FUNCTION PANEL
Albumin: 1.4 g/dL — ABNORMAL LOW (ref 3.5–5.0)
Anion gap: 10 (ref 5–15)
BUN: 27 mg/dL — ABNORMAL HIGH (ref 6–20)
CO2: 32 mmol/L (ref 22–32)
Calcium: 7.3 mg/dL — ABNORMAL LOW (ref 8.9–10.3)
Chloride: 94 mmol/L — ABNORMAL LOW (ref 98–111)
Creatinine, Ser: 0.9 mg/dL (ref 0.61–1.24)
GFR, Estimated: >60 mL/min (ref >60)
Glucose, Bld: 98 mg/dL (ref 70–99)
Phosphorus: 6.2 mg/dL — ABNORMAL HIGH (ref 2.5–4.6)
Potassium: 5.8 mmol/L — ABNORMAL HIGH (ref 3.5–5.1)
Sodium: 136 mmol/L (ref 135–145)

## 2019-12-02 LAB — TRIGLYCERIDES: Triglycerides: 98 mg/dL (ref <150)

## 2019-12-02 LAB — VANCOMYCIN, TROUGH: Vancomycin Tr: 23 ug/mL (ref 15–20)

## 2019-12-02 LAB — MAGNESIUM: Magnesium: 2 mg/dL (ref 1.7–2.4)

## 2019-12-02 NOTE — Progress Notes (Addendum)
Pulmonary Critical Care Medicine [facility] [place]   PULMONARY CRITICAL CARE SERVICE  PROGRESS NOTE  Date of Service: 12/02/2019  Connor Roach  KWI:[id number]  DOB: 02-28-1960   DOA: 10/30/2019  Referring Physician: Carron Curie, MD  HPI: Connor Roach is a 59 y.o. male seen for follow up of Acute on Chronic Respiratory Failure.  Patient remains on full support assist-control mode rate of 16 with FiO2 of 45% currently satting well no distress.  Medications: Reviewed on Rounds  Physical Exam:  Vitals: Pulse 60 respiration 17 BP 85/51 O2 sat 100% temp 98.3  Ventilator Settings ventilator mode AC VC plus rate of 16 tidal volume 400 PEEP of 5 and FiO2 45%  . General: Comfortable at this time . Eyes: Grossly normal lids, irises & conjunctiva . ENT: grossly tongue is normal . Neck: no obvious mass . Cardiovascular: S1 S2 normal no gallop . Respiratory: No rales or rhonchi noted . Abdomen: soft . Skin: no rash seen on limited exam . Musculoskeletal: not rigid . Psychiatric:unable to assess . Neurologic: no seizure no involuntary movements         Lab Data:   Basic Metabolic Panel: Recent Labs  Lab 11/27/19 0656 11/28/19 0452 11/29/19 0819 12/01/19 0501  NA 144  --  138 134*  K 3.6  --  3.7 4.4  CL 99  --  96* 93*  CO2 36*  --  31 31  GLUCOSE 114*  --  100* 103*  BUN 15  --  16 14  CREATININE 0.84  --  0.81 1.02  CALCIUM 7.6*  --  7.3* 7.1*  MG 2.0 2.0 2.0 1.8  PHOS 3.9 3.8 3.9 4.5    ABG: Recent Labs  Lab 11/29/19 1835 11/29/19 2224 11/30/19 0348 11/30/19 0920 12/01/19 1038  PHART 7.473* 7.451* 7.388 7.429 7.489*  PCO2ART 49.4* 51.8* 59.0* 53.9* 44.1  PO2ART 45.5* 48.3* 53.3* 53.6* 161*  HCO3 35.8* 35.7* 34.9* 35.1* 33.0*  O2SAT 80.2 81.6 84.0 85.5 99.3    Liver Function Tests: Recent Labs  Lab 11/29/19 0819 12/01/19 0501  ALBUMIN 1.4* 1.3*   No results for input(s): LIPASE, AMYLASE in the last 168 hours. No results for  input(s): AMMONIA in the last 168 hours.  CBC: Recent Labs  Lab 11/27/19 0656 11/29/19 0819 12/01/19 0501 12/01/19 1159  WBC 8.5 10.3 10.1 9.8  HGB 7.5* 7.7* 7.2* 7.7*  HCT 24.3* 25.3* 22.0* 25.7*  MCV 95.7 92.7 90.2 93.5  PLT 142* 132* 137* 126*    Cardiac Enzymes: No results for input(s): CKTOTAL, CKMB, CKMBINDEX, TROPONINI in the last 168 hours.  BNP (last 3 results) No results for input(s): BNP in the last 8760 hours.  ProBNP (last 3 results) No results for input(s): PROBNP in the last 8760 hours.  Radiological Exams: DG CHEST PORT 1 VIEW  Result Date: 12/01/2019 CLINICAL DATA:  Encounter for pneumonia. EXAM: PORTABLE CHEST 1 VIEW COMPARISON:  November 30, 2019 FINDINGS: The tracheostomy tube is stable. The NG tube terminates below today's film. No pneumothorax. Diffuse bilateral pulmonary infiltrates may be slightly improved on the right and are similar to slightly more prominent on the left in the interval. There is a right-sided pleural effusion with underlying opacity. No other interval changes. IMPRESSION: 1. Diffuse bilateral pulmonary infiltrates remain as above. The findings may represent multifocal pneumonia versus ARDS/asymmetric edema. There has been mild improvement on the right while the opacities are mildly more pronounced on the left in the interval. 2. Persistent right  pleural effusion with underlying opacity. Electronically Signed   By: Gerome Sam III M.D   On: 12/01/2019 09:44    Assessment/Plan Active Problems:   Acute on chronic respiratory failure with hypoxia (HCC)   Chronic atrial fibrillation (HCC)   Seizure disorder (HCC)   Metabolic encephalopathy   COPD, severe (HCC)   1. Acute on chronic respiratory failure hypoxia patient will continue on full support settings as above.  We will continue to wean FiO2 and PEEP as tolerated.  Continue aggressive pulmonary toilet supportive measures. 2. Chronic atrial fibrillation rate is controlled at this  time 3. Seizure disorder no active seizures 4. Metabolic encephalopathy slow to improve 5. Severe COPD medical management   I have personally seen and evaluated the patient, evaluated laboratory and imaging results, formulated the assessment and plan and placed orders. The Patient requires high complexity decision making with multiple systems involvement.  Rounds were done with the Respiratory Therapy Director and Staff therapists and discussed with nursing staff also.  Yevonne Pax, MD [facility] Pulmonary Critical Care Medicine Sleep Medicine

## 2019-12-03 ENCOUNTER — Other Ambulatory Visit (HOSPITAL_COMMUNITY): Payer: BC Managed Care – PPO

## 2019-12-03 DIAGNOSIS — J9621 Acute and chronic respiratory failure with hypoxia: Secondary | ICD-10-CM | POA: Diagnosis not present

## 2019-12-03 DIAGNOSIS — J449 Chronic obstructive pulmonary disease, unspecified: Secondary | ICD-10-CM | POA: Diagnosis not present

## 2019-12-03 DIAGNOSIS — I482 Chronic atrial fibrillation, unspecified: Secondary | ICD-10-CM | POA: Diagnosis not present

## 2019-12-03 DIAGNOSIS — G9341 Metabolic encephalopathy: Secondary | ICD-10-CM | POA: Diagnosis not present

## 2019-12-03 NOTE — Progress Notes (Signed)
Pulmonary Critical Care Medicine [facility] [place]   PULMONARY CRITICAL CARE SERVICE  PROGRESS NOTE  Date of Service: 12/03/2019  Connor Roach  HYW:[id number]  DOB: [birth year]   DOA: 10/30/2019  Referring Physician: Carron Curie, MD  HPI: Connor Roach is a 59 y.o. male seen for follow up of Acute on Chronic Respiratory Failure.  Patient remains on full support on assist control mode has been on 45% FiO2 good saturations are noted  Medications: Reviewed on Rounds  Physical Exam:  Vitals: Temperature 97.7 pulse 79 respiratory 15 blood pressure 99/52 saturations 94%  Ventilator Settings on assist control FiO2 is 45% tidal volume 450 with a PEEP of 7  . General: Comfortable at this time . Eyes: Grossly normal lids, irises & conjunctiva . ENT: grossly tongue is normal . Neck: no obvious mass . Cardiovascular: S1 S2 normal no gallop . Respiratory: Scattered rhonchi expansion is equal . Abdomen: soft . Skin: no rash seen on limited exam . Musculoskeletal: not rigid . Psychiatric:unable to assess . Neurologic: no seizure no involuntary movements         Lab Data:   Basic Metabolic Panel: Recent Labs  Lab 11/27/19 0656 11/28/19 0452 11/29/19 0819 12/01/19 0501 12/02/19 1745  NA 144  --  138 134* 136  K 3.6  --  3.7 4.4 5.8*  CL 99  --  96* 93* 94*  CO2 36*  --  31 31 32  GLUCOSE 114*  --  100* 103* 98  BUN 15  --  16 14 27*  CREATININE 0.84  --  0.81 1.02 0.90  CALCIUM 7.6*  --  7.3* 7.1* 7.3*  MG 2.0 2.0 2.0 1.8 2.0  PHOS 3.9 3.8 3.9 4.5 6.2*    ABG: Recent Labs  Lab 11/29/19 1835 11/29/19 2224 11/30/19 0348 11/30/19 0920 12/01/19 1038  PHART 7.473* 7.451* 7.388 7.429 7.489*  PCO2ART 49.4* 51.8* 59.0* 53.9* 44.1  PO2ART 45.5* 48.3* 53.3* 53.6* 161*  HCO3 35.8* 35.7* 34.9* 35.1* 33.0*  O2SAT 80.2 81.6 84.0 85.5 99.3    Liver Function Tests: Recent Labs  Lab 11/29/19 0819 12/01/19 0501 12/02/19 1745  ALBUMIN 1.4* 1.3* 1.4*   No  results for input(s): LIPASE, AMYLASE in the last 168 hours. No results for input(s): AMMONIA in the last 168 hours.  CBC: Recent Labs  Lab 11/27/19 0656 11/29/19 0819 12/01/19 0501 12/01/19 1159 12/02/19 2055  WBC 8.5 10.3 10.1 9.8 10.3  HGB 7.5* 7.7* 7.2* 7.7* 8.1*  HCT 24.3* 25.3* 22.0* 25.7* 25.6*  MCV 95.7 92.7 90.2 93.5 90.5  PLT 142* 132* 137* 126* 141*    Cardiac Enzymes: No results for input(s): CKTOTAL, CKMB, CKMBINDEX, TROPONINI in the last 168 hours.  BNP (last 3 results) No results for input(s): BNP in the last 8760 hours.  ProBNP (last 3 results) No results for input(s): PROBNP in the last 8760 hours.  Radiological Exams: No results found.  Assessment/Plan Active Problems:   Acute on chronic respiratory failure with hypoxia (HCC)   Chronic atrial fibrillation (HCC)   Seizure disorder (HCC)   Metabolic encephalopathy   COPD, severe (HCC)   1. Acute on chronic respiratory failure with hypoxia we'll continue with full support on assist control patient's mechanics right now or not good for weaning respiratory therapy will continue to assess 2. Chronic atrial fibrillation rate is controlled 3. Seizure disorder no active seizures noted 4. Severe COPD medical management 5. Metabolic encephalopathy supportive care no changes are noted   I  have personally seen and evaluated the patient, evaluated laboratory and imaging results, formulated the assessment and plan and placed orders. The Patient requires high complexity decision making with multiple systems involvement.  Rounds were done with the Respiratory Therapy Director and Staff therapists and discussed with nursing staff also.  Allyne Gee, MD [facility] Pulmonary Critical Care Medicine Sleep Medicine

## 2019-12-04 ENCOUNTER — Other Ambulatory Visit (HOSPITAL_COMMUNITY): Payer: BC Managed Care – PPO

## 2019-12-04 DIAGNOSIS — G9341 Metabolic encephalopathy: Secondary | ICD-10-CM | POA: Diagnosis not present

## 2019-12-04 DIAGNOSIS — J449 Chronic obstructive pulmonary disease, unspecified: Secondary | ICD-10-CM | POA: Diagnosis not present

## 2019-12-04 DIAGNOSIS — I482 Chronic atrial fibrillation, unspecified: Secondary | ICD-10-CM | POA: Diagnosis not present

## 2019-12-04 DIAGNOSIS — J9621 Acute and chronic respiratory failure with hypoxia: Secondary | ICD-10-CM | POA: Diagnosis not present

## 2019-12-04 LAB — CULTURE, RESPIRATORY W GRAM STAIN

## 2019-12-04 LAB — RENAL FUNCTION PANEL
Albumin: 1.3 g/dL — ABNORMAL LOW (ref 3.5–5.0)
Anion gap: 8 (ref 5–15)
BUN: 30 mg/dL — ABNORMAL HIGH (ref 6–20)
CO2: 36 mmol/L — ABNORMAL HIGH (ref 22–32)
Calcium: 7.4 mg/dL — ABNORMAL LOW (ref 8.9–10.3)
Chloride: 96 mmol/L — ABNORMAL LOW (ref 98–111)
Creatinine, Ser: 0.91 mg/dL (ref 0.61–1.24)
GFR, Estimated: >60 mL/min (ref >60)
Glucose, Bld: 106 mg/dL — ABNORMAL HIGH (ref 70–99)
Phosphorus: 5.1 mg/dL — ABNORMAL HIGH (ref 2.5–4.6)
Potassium: 4.8 mmol/L (ref 3.5–5.1)
Sodium: 140 mmol/L (ref 135–145)

## 2019-12-04 LAB — VANCOMYCIN, TROUGH: Vancomycin Tr: 8 ug/mL — ABNORMAL LOW (ref 15–20)

## 2019-12-04 LAB — SARS CORONAVIRUS 2 (TAT 6-24 HRS): SARS Coronavirus 2: NEGATIVE

## 2019-12-04 MED ORDER — IOHEXOL 300 MG/ML  SOLN
100.0000 mL | Freq: Once | INTRAMUSCULAR | Status: AC | PRN
  Administered 2019-12-04: 100 mL via INTRAVENOUS

## 2019-12-04 NOTE — Progress Notes (Addendum)
Pulmonary Critical Care Medicine [facility] [place]   PULMONARY CRITICAL CARE SERVICE  PROGRESS NOTE  Date of Service: 12/04/2019  CHIEF WALKUP  OEV:[id number]  DOB: 10/31/1960   DOA: 10/30/2019  Referring Physician: Carron Curie, MD  HPI: Connor Roach is a 59 y.o. male seen for follow up of Acute on Chronic Respiratory Failure.  Patient continues on full support at this time is unable to wean today currently rate of 16 and FiO2 of 60%.  Medications: Reviewed on Rounds  Physical Exam:  Vitals: Pulse 80 respirations 22 BP 96/48 O2 sat 94% temp 98.5  Ventilator Settings ventilator mode AC VC rate of 16 tidal volume 400 PEEP of 7 FiO2 60%  . General: Comfortable at this time . Eyes: Grossly normal lids, irises & conjunctiva . ENT: grossly tongue is normal . Neck: no obvious mass . Cardiovascular: S1 S2 normal no gallop . Respiratory: No rales or rhonchi noted . Abdomen: soft . Skin: no rash seen on limited exam . Musculoskeletal: not rigid . Psychiatric:unable to assess . Neurologic: no seizure no involuntary movements         Lab Data:   Basic Metabolic Panel: Recent Labs  Lab 11/28/19 0452 11/29/19 0819 12/01/19 0501 12/02/19 1745  NA  --  138 134* 136  K  --  3.7 4.4 5.8*  CL  --  96* 93* 94*  CO2  --  31 31 32  GLUCOSE  --  100* 103* 98  BUN  --  16 14 27*  CREATININE  --  0.81 1.02 0.90  CALCIUM  --  7.3* 7.1* 7.3*  MG 2.0 2.0 1.8 2.0  PHOS 3.8 3.9 4.5 6.2*    ABG: Recent Labs  Lab 11/29/19 1835 11/29/19 2224 11/30/19 0348 11/30/19 0920 12/01/19 1038  PHART 7.473* 7.451* 7.388 7.429 7.489*  PCO2ART 49.4* 51.8* 59.0* 53.9* 44.1  PO2ART 45.5* 48.3* 53.3* 53.6* 161*  HCO3 35.8* 35.7* 34.9* 35.1* 33.0*  O2SAT 80.2 81.6 84.0 85.5 99.3    Liver Function Tests: Recent Labs  Lab 11/29/19 0819 12/01/19 0501 12/02/19 1745  ALBUMIN 1.4* 1.3* 1.4*   No results for input(s): LIPASE, AMYLASE in the last 168 hours. No results for  input(s): AMMONIA in the last 168 hours.  CBC: Recent Labs  Lab 11/29/19 0819 12/01/19 0501 12/01/19 1159 12/02/19 2055  WBC 10.3 10.1 9.8 10.3  HGB 7.7* 7.2* 7.7* 8.1*  HCT 25.3* 22.0* 25.7* 25.6*  MCV 92.7 90.2 93.5 90.5  PLT 132* 137* 126* 141*    Cardiac Enzymes: No results for input(s): CKTOTAL, CKMB, CKMBINDEX, TROPONINI in the last 168 hours.  BNP (last 3 results) No results for input(s): BNP in the last 8760 hours.  ProBNP (last 3 results) No results for input(s): PROBNP in the last 8760 hours.  Radiological Exams: DG CHEST PORT 1 VIEW  Result Date: 12/04/2019 CLINICAL DATA:  Bilateral pulmonary infiltrates. EXAM: PORTABLE CHEST 1 VIEW COMPARISON:  12/01/2019.  11/30/2019 P FINDINGS: Tracheostomy and NG tube in stable position. Heart size stable. Diffuse severe bilateral pulmonary infiltrates/edema again noted. Small bilateral pleural effusions again noted. Lungs noted on prior exam. No pneumothorax. IMPRESSION: 1. Tracheostomy and NG tube in stable position. 2. Diffuse severe bilateral pulmonary infiltrates/edema again noted. Small bilateral pleural effusions again noted. Similar findings noted on prior exam. Electronically Signed   By: Maisie Fus  Register   On: 12/04/2019 07:45   DG Abd Portable 1V  Result Date: 12/03/2019 CLINICAL DATA:  NG placement EXAM: PORTABLE  ABDOMEN - 1 VIEW COMPARISON:  11/28/2019 FINDINGS: NG tube is pulled back from the prior study. The tip is now in the region the gastric antrum and the side hole in the body of the stomach. No obstructed bowel loops. IMPRESSION: NG tip in the region of the gastric antrum. Electronically Signed   By: Marlan Palau M.D.   On: 12/03/2019 15:27    Assessment/Plan Active Problems:   Acute on chronic respiratory failure with hypoxia (HCC)   Chronic atrial fibrillation (HCC)   Seizure disorder (HCC)   Metabolic encephalopathy   COPD, severe (HCC)   1. Acute on chronic respiratory failure with hypoxia patient  continues unable to wean we will supposed to pressure support today however is unable to do so.  We will continue on full support on the vent at this time continue to assess for weaning.  Continue aggressive pulmonary toilet supportive measures. 2. Chronic atrial fibrillation rate is controlled 3. Seizure disorder no active seizures noted 4. Severe COPD medical management 5. Metabolic encephalopathy supportive care no changes are noted   I have personally seen and evaluated the patient, evaluated laboratory and imaging results, formulated the assessment and plan and placed orders. The Patient requires high complexity decision making with multiple systems involvement.  Rounds were done with the Respiratory Therapy Director and Staff therapists and discussed with nursing staff also.  Yevonne Pax, MD [facility] Pulmonary Critical Care Medicine Sleep Medicine

## 2019-12-04 NOTE — Progress Notes (Signed)
Referring Physician(s): Carolyne Fiscal NP  Supervising Physician: Irish Lack  Patient Status:  Connor Roach - [facility]  Chief Complaint:  Gastsotmy tube replacement  Subjective:  Unable to assesses due to patient condition. Patient nonverabl   Allergies: Patient has no allergy information on record.  Medications: Prior to Admission medications   Not on File     Vital Signs: There were no vitals taken for this visit.  Physical Exam Vitals and nursing note reviewed.  Constitutional:      Appearance: He is well-developed.  Pulmonary:     Comments: Vented and trached Abdominal:     Comments: colostomy bag over the g tube exit site. Distal portion of the NG tube noted to be coiled in the osotmy bag.   Skin:    General: Skin is dry.     Imaging: DG CHEST PORT 1 VIEW  Result Date: 12/04/2019 CLINICAL DATA:  Bilateral pulmonary infiltrates. EXAM: PORTABLE CHEST 1 VIEW COMPARISON:  12/01/2019.  11/30/2019 P FINDINGS: Tracheostomy and NG tube in stable position. Heart size stable. Diffuse severe bilateral pulmonary infiltrates/edema again noted. Small bilateral pleural effusions again noted. Lungs noted on prior exam. No pneumothorax. IMPRESSION: 1. Tracheostomy and NG tube in stable position. 2. Diffuse severe bilateral pulmonary infiltrates/edema again noted. Small bilateral pleural effusions again noted. Similar findings noted on prior exam. Electronically Signed   By: Maisie Fus  Register   On: 12/04/2019 07:45   DG CHEST PORT 1 VIEW  Result Date: 12/01/2019 CLINICAL DATA:  Encounter for pneumonia. EXAM: PORTABLE CHEST 1 VIEW COMPARISON:  November 30, 2019 FINDINGS: The tracheostomy tube is stable. The NG tube terminates below today's film. No pneumothorax. Diffuse bilateral pulmonary infiltrates may be slightly improved on the right and are similar to slightly more prominent on the left in the interval. There is a right-sided pleural effusion with underlying opacity. No other  interval changes. IMPRESSION: 1. Diffuse bilateral pulmonary infiltrates remain as above. The findings may represent multifocal pneumonia versus ARDS/asymmetric edema. There has been mild improvement on the right while the opacities are mildly more pronounced on the left in the interval. 2. Persistent right pleural effusion with underlying opacity. Electronically Signed   By: Gerome Sam III M.D   On: 12/01/2019 09:44   DG Abd Portable 1V  Result Date: 12/03/2019 CLINICAL DATA:  NG placement EXAM: PORTABLE ABDOMEN - 1 VIEW COMPARISON:  11/28/2019 FINDINGS: NG tube is pulled back from the prior study. The tip is now in the region the gastric antrum and the side hole in the body of the stomach. No obstructed bowel loops. IMPRESSION: NG tip in the region of the gastric antrum. Electronically Signed   By: Marlan Palau M.D.   On: 12/03/2019 15:27    Labs:  CBC: Recent Labs    11/29/19 0819 12/01/19 0501 12/01/19 1159 12/02/19 2055  WBC 10.3 10.1 9.8 10.3  HGB 7.7* 7.2* 7.7* 8.1*  HCT 25.3* 22.0* 25.7* 25.6*  PLT 132* 137* 126* 141*    COAGS: Recent Labs    10/31/19 1115  INR 1.2  APTT 34    BMP: Recent Labs    11/16/19 0718 11/16/19 0718 11/17/19 0601 11/17/19 0601 11/19/19 0623 11/19/19 0623 11/20/19 0641 11/21/19 1113 11/27/19 0656 11/29/19 0819 12/01/19 0501 12/02/19 1745  NA 143   < > 139   < > 142   < > 142   < > 144 138 134* 136  K 3.2*   < > 3.7   < > 3.4*   < >  3.2*   < > 3.6 3.7 4.4 5.8*  CL 111   < > 106   < > 107   < > 101   < > 99 96* 93* 94*  CO2 26   < > 23   < > 29   < > 32   < > 36* 31 31 32  GLUCOSE 121*   < > 133*   < > 112*   < > 122*   < > 114* 100* 103* 98  BUN 23*   < > 22*   < > 23*   < > 23*   < > 15 16 14  27*  CALCIUM 7.6*   < > 7.5*   < > 7.8*   < > 7.6*   < > 7.6* 7.3* 7.1* 7.3*  CREATININE 0.90   < > 0.91   < > 0.88   < > 0.92   < > 0.84 0.81 1.02 0.90  GFRNONAA >60   < > >60   < > >60   < > >60   < > >60 >60 >60 >60  GFRAA >60  --   >60  --  >60  --  >60  --   --   --   --   --    < > = values in this interval not displayed.    LIVER FUNCTION TESTS: Recent Labs    10/31/19 1115 11/01/19 0720 11/12/19 1115 11/13/19 0956 11/22/19 0533 11/29/19 0819 12/01/19 0501 12/02/19 1745  BILITOT 0.5  --  0.3  --   --   --   --   --   AST 29  --  13*  --   --   --   --   --   ALT 34  --  14  --   --   --   --   --   ALKPHOS 63  --  36*  --   --   --   --   --   PROT 5.8*  --  4.2*  --   --   --   --   --   ALBUMIN 1.9*   < > 1.0*   < > 1.4* 1.4* 1.3* 1.4*   < > = values in this interval not displayed.    Assessment and Plan:  59 y.o. male [facility] patient. History of acute on chronic respiratory failure. Vented with NG tube to suction. Patient has a Gastrostomy tube placed at OSH. Per Team there was purulence noted around the exit site. The gastrostomy tube was removed on 9.20.21 to allow the site time to heal. Team requesting gastrostomy tube replacement at this time.   Evaluation of the patient at bedside shows a colostomy bag over the gastrostomy tube exit site. Drainage noted to be in the colostomy bag and the distal end of patient's NG tube is coiled within the bag.  Details of original gastrostomy tube size and placement time unknown. Concern for erosion around the exit site that may of increased the size of prior gastrostomy tube.  Case discussed with IR Attending Dr. 11-17-1974. Request a CT abd pelvis with contrast to rule out any abscesses.    This was communicated directly to the provider at [facility]. Recommend Team not utilize NG tube at this time.  IR to continue to follow .  Electronically Signed: Fredia Sorrow, NP 12/04/2019, 10:54 AM   I spent a total of 15 Minutes at the the patient's  bedside AND on the patient's [facility] floor or unit, greater than 50% of which was counseling/coordinating care for gastrostomy tube replacement

## 2019-12-05 ENCOUNTER — Other Ambulatory Visit (HOSPITAL_COMMUNITY): Payer: BC Managed Care – PPO

## 2019-12-05 DIAGNOSIS — I482 Chronic atrial fibrillation, unspecified: Secondary | ICD-10-CM | POA: Diagnosis not present

## 2019-12-05 DIAGNOSIS — J449 Chronic obstructive pulmonary disease, unspecified: Secondary | ICD-10-CM | POA: Diagnosis not present

## 2019-12-05 DIAGNOSIS — J9621 Acute and chronic respiratory failure with hypoxia: Secondary | ICD-10-CM | POA: Diagnosis not present

## 2019-12-05 DIAGNOSIS — G9341 Metabolic encephalopathy: Secondary | ICD-10-CM | POA: Diagnosis not present

## 2019-12-05 HISTORY — PX: IR THORACENTESIS ASP PLEURAL SPACE W/IMG GUIDE: IMG5380

## 2019-12-05 LAB — RENAL FUNCTION PANEL
Albumin: 1.3 g/dL — ABNORMAL LOW (ref 3.5–5.0)
Anion gap: 12 (ref 5–15)
BUN: 32 mg/dL — ABNORMAL HIGH (ref 6–20)
CO2: 32 mmol/L (ref 22–32)
Calcium: 7.3 mg/dL — ABNORMAL LOW (ref 8.9–10.3)
Chloride: 97 mmol/L — ABNORMAL LOW (ref 98–111)
Creatinine, Ser: 0.96 mg/dL (ref 0.61–1.24)
GFR, Estimated: >60 mL/min (ref >60)
Glucose, Bld: 101 mg/dL — ABNORMAL HIGH (ref 70–99)
Phosphorus: 5.3 mg/dL — ABNORMAL HIGH (ref 2.5–4.6)
Potassium: 4.5 mmol/L (ref 3.5–5.1)
Sodium: 141 mmol/L (ref 135–145)

## 2019-12-05 LAB — CBC
HCT: 21 % — ABNORMAL LOW (ref 39.0–52.0)
Hemoglobin: 6.4 g/dL — CL (ref 13.0–17.0)
MCH: 28.2 pg (ref 26.0–34.0)
MCHC: 30.5 g/dL (ref 30.0–36.0)
MCV: 92.5 fL (ref 80.0–100.0)
Platelets: 81 10*3/uL — ABNORMAL LOW (ref 150–400)
RBC: 2.27 MIL/uL — ABNORMAL LOW (ref 4.22–5.81)
RDW: 15.5 % (ref 11.5–15.5)
WBC: 6.5 10*3/uL (ref 4.0–10.5)
nRBC: 0 % (ref 0.0–0.2)

## 2019-12-05 LAB — AMMONIA: Ammonia: 69 umol/L — ABNORMAL HIGH (ref 9–35)

## 2019-12-05 LAB — MAGNESIUM: Magnesium: 2 mg/dL (ref 1.7–2.4)

## 2019-12-05 LAB — PREPARE RBC (CROSSMATCH)

## 2019-12-05 MED ORDER — LIDOCAINE HCL (PF) 1 % IJ SOLN
INTRAMUSCULAR | Status: AC
  Filled 2019-12-05: qty 30

## 2019-12-05 MED ORDER — LIDOCAINE HCL (PF) 1 % IJ SOLN
INTRAMUSCULAR | Status: AC | PRN
  Administered 2019-12-05: 10 mL

## 2019-12-05 NOTE — Progress Notes (Signed)
Referring Physician(s): Li,C  Supervising Physician: Gilmer Mor  Patient Status:  [facility]   Chief Complaint:  Malpositioned/dislodged gastrostomy tube  Subjective: Pt on vent, nonverbal; hx of placement of G tube at outside facility which was found to be malpositioned recently and removed; request received for tube replacement; NG tube in place; s/p rt thoracentesis today   Past Medical History:  Diagnosis Date  . Acute on chronic respiratory failure with hypoxia (HCC)   . Chronic atrial fibrillation (HCC)   . COPD, severe (HCC)   . Metabolic encephalopathy   . Seizure disorder [facility])        Allergies: Patient has no allergy information on record.  Medications: Prior to Admission medications   Not on File     Vital Signs:Pulse 84 respirations 21 BP 117/60 O2 sat 95% temp 99.1   Physical Exam awake, on vent; chest- coarse BS bilat, dim at bases; heart- tachy but regular; abd soft, ostomy bag overlies prev G tube insertion site  Imaging: CT ABDOMEN PELVIS W CONTRAST  Result Date: 12/04/2019 CLINICAL DATA:  Intra-abdominal abscess. EXAM: CT ABDOMEN AND PELVIS WITH CONTRAST TECHNIQUE: Multidetector CT imaging of the abdomen and pelvis was performed using the standard protocol following bolus administration of intravenous contrast. CONTRAST:  OMNIPAQUE IOHEXOL 300 MG/ML  SOLN COMPARISON:  Radiography yesterday.  CT 11/01/2019. FINDINGS: Lower chest: Large bilateral pleural effusions layering dependently with dependent pulmonary atelectasis. Abnormal density in the non dependent lung could be edema or pneumonia. Hepatobiliary: No liver parenchymal abnormality. No calcified gallstones or evidence of gallbladder inflammation. Pancreas: Normal Spleen: Normal Adrenals/Urinary Tract: Adrenal glands are normal. No significant renal abnormality. Small cysts. No hydronephrosis. Foley catheter in bladder. Stomach/Bowel: Rectal tube in place. Nasogastric tube in place. No  visible gastrostomy tube presently. No complication seen at the previous gastrostomy approach. No sign of small bowel obstruction. Fluid stool in the colon without evidence of primary colon pathology. Vascular/Lymphatic: Aortic atherosclerosis. No aneurysm. IVC is normal. No retroperitoneal adenopathy. Reproductive: Normal Other: Some free fluid in the abdomen most notable along the liver edge and in the pelvic cul-de-sac. Distribution appears free, without obvious loculation or discernible abscess. This fluid could possibly be infected but there is no specific imaging sign of that. Musculoskeletal: Normal IMPRESSION: 1. Large bilateral pleural effusions layering dependently with dependent pulmonary atelectasis. Abnormal density in the non dependent lung could be edema or pneumonia. 2. Some free fluid in the abdomen most notable along the liver edge and in the pelvic cul-de-sac. Distribution appears free, without obvious loculation or discernible abscess. This fluid could possibly be infected but there is no specific imaging sign of that. Aortic Atherosclerosis (ICD10-I70.0). Electronically Signed   By: Paulina Fusi M.D.   On: 12/04/2019 15:55   DG CHEST PORT 1 VIEW  Result Date: 12/04/2019 CLINICAL DATA:  Right PICC line placement. EXAM: PORTABLE CHEST 1 VIEW COMPARISON:  Radiograph earlier, same date. FINDINGS: The tracheostomy tube is stable.  The NG tube is stable. New right-sided PICC line tip is in the right atrium, approximately 10 cm below the carina. This could be retracted 5 cm. Persistent diffuse interstitial and nodular airspace process. Suspect bilateral pleural effusions. IMPRESSION: 1. New right-sided PICC line tip is in the right atrium. This could be retracted 5 cm. 2. Persistent diffuse interstitial and nodular airspace process. Electronically Signed   By: Rudie Meyer M.D.   On: 12/04/2019 12:39   DG CHEST PORT 1 VIEW  Result Date: 12/04/2019 CLINICAL DATA:  Bilateral pulmonary  infiltrates. EXAM: PORTABLE CHEST 1 VIEW COMPARISON:  12/01/2019.  11/30/2019 P FINDINGS: Tracheostomy and NG tube in stable position. Heart size stable. Diffuse severe bilateral pulmonary infiltrates/edema again noted. Small bilateral pleural effusions again noted. Lungs noted on prior exam. No pneumothorax. IMPRESSION: 1. Tracheostomy and NG tube in stable position. 2. Diffuse severe bilateral pulmonary infiltrates/edema again noted. Small bilateral pleural effusions again noted. Similar findings noted on prior exam. Electronically Signed   By: Maisie Fus  Register   On: 12/04/2019 07:45   DG Abd Portable 1V  Result Date: 12/03/2019 CLINICAL DATA:  NG placement EXAM: PORTABLE ABDOMEN - 1 VIEW COMPARISON:  11/28/2019 FINDINGS: NG tube is pulled back from the prior study. The tip is now in the region the gastric antrum and the side hole in the body of the stomach. No obstructed bowel loops. IMPRESSION: NG tip in the region of the gastric antrum. Electronically Signed   By: Marlan Palau M.D.   On: 12/03/2019 15:27    Labs:  CBC: Recent Labs    12/01/19 0501 12/01/19 1159 12/02/19 2055 12/05/19 0511  WBC 10.1 9.8 10.3 6.5  HGB 7.2* 7.7* 8.1* 6.4*  HCT 22.0* 25.7* 25.6* 21.0*  PLT 137* 126* 141* 81*    COAGS: Recent Labs    10/31/19 1115  INR 1.2  APTT 34    BMP: Recent Labs    11/16/19 0718 11/16/19 0718 11/17/19 0601 11/17/19 0601 11/19/19 3810 11/19/19 0623 11/20/19 0641 11/21/19 1113 12/01/19 0501 12/02/19 1745 12/04/19 1011 12/05/19 0511  NA 143   < > 139   < > 142   < > 142   < > 134* 136 140 141  K 3.2*   < > 3.7   < > 3.4*   < > 3.2*   < > 4.4 5.8* 4.8 4.5  CL 111   < > 106   < > 107   < > 101   < > 93* 94* 96* 97*  CO2 26   < > 23   < > 29   < > 32   < > 31 32 36* 32  GLUCOSE 121*   < > 133*   < > 112*   < > 122*   < > 103* 98 106* 101*  BUN 23*   < > 22*   < > 23*   < > 23*   < > 14 27* 30* 32*  CALCIUM 7.6*   < > 7.5*   < > 7.8*   < > 7.6*   < > 7.1* 7.3*  7.4* 7.3*  CREATININE 0.90   < > 0.91   < > 0.88   < > 0.92   < > 1.02 0.90 0.91 0.96  GFRNONAA >60   < > >60   < > >60   < > >60   < > >60 >60 >60 >60  GFRAA >60  --  >60  --  >60  --  >60  --   --   --   --   --    < > = values in this interval not displayed.    LIVER FUNCTION TESTS: Recent Labs    10/31/19 1115 11/01/19 0720 11/12/19 1115 11/13/19 0956 12/01/19 0501 12/02/19 1745 12/04/19 1011 12/05/19 0511  BILITOT 0.5  --  0.3  --   --   --   --   --   AST 29  --  13*  --   --   --   --   --  ALT 34  --  14  --   --   --   --   --   ALKPHOS 63  --  36*  --   --   --   --   --   PROT 5.8*  --  4.2*  --   --   --   --   --   ALBUMIN 1.9*   < > 1.0*   < > 1.3* 1.4* 1.3* 1.3*   < > = values in this interval not displayed.    Assessment and Plan: Pt with PMH COPD, seizure d/o, encephalopathy, resp failure with trach -on vent, afib, bipolar d/o, polysubstance abuse, dysphagia with prior placementof G tube at outside facility, recently noted to be malpositioned/removed;  CT A/P yesterday revealed:  1. Large bilateral pleural effusions layering dependently with dependent pulmonary atelectasis. Abnormal density in the non dependent lung could be edema or pneumonia. 2. Some free fluid in the abdomen most notable along the liver edge and in the pelvic cul-de-sac. Distribution appears free, without obvious loculation or discernible abscess. This fluid could possibly be infected but there is no specific imaging sign of that.  Aortic Atherosclerosis  Imaging reviewed by IR MD; request received for G tube replacement/new placement; consent obtained from spouse. Procedure planned for 10/21.   Electronically Signed: D. Jeananne Rama, PA-C 12/05/2019, 3:01 PM   I spent a total of 20 minutes at the the patient's bedside AND on the patient's hospital floor or unit, greater than 50% of which was counseling/coordinating care for gastrostomy tube replacement/new placement    Patient  ID: Connor Roach, male   DOB: 08/10/1960, 59 y.o.   MRN: [id number]

## 2019-12-05 NOTE — Procedures (Signed)
Ultrasound-guided therapeutic right thoracentesis performed yielding 700 cc of yellow  fluid. No immediate complications. Follow-up chest x-ray pending. EBL none.

## 2019-12-05 NOTE — Progress Notes (Addendum)
Pulmonary Critical Care Medicine [facility] [place]   PULMONARY CRITICAL CARE SERVICE  PROGRESS NOTE  Date of Service: 12/05/2019  Connor Roach  XNA:[id number]  DOB: 1960-11-25   DOA: 10/30/2019  Referring Physician: Carron Curie, MD  HPI: Connor Roach is a 59 y.o. male seen for follow up of Acute on Chronic Respiratory Failure.  Patient continues on full support unable to wean at this time currently rate of 16 with an FiO2 of 60%.  Medications: Reviewed on Rounds  Physical Exam:  Vitals: Pulse 84 respirations 21 BP 117/60 O2 sat 95% temp 99.1  Ventilator Settings ventilator mode AC VC plus rate of 16 tidal volume 400 PEEP of 7 with an FiO2 of 60%  . General: Comfortable at this time . Eyes: Grossly normal lids, irises & conjunctiva . ENT: grossly tongue is normal . Neck: no obvious mass . Cardiovascular: S1 S2 normal no gallop . Respiratory: Coarse breath sounds . Abdomen: soft . Skin: no rash seen on limited exam . Musculoskeletal: not rigid . Psychiatric:unable to assess . Neurologic: no seizure no involuntary movements         Lab Data:   Basic Metabolic Panel: Recent Labs  Lab 11/29/19 0819 12/01/19 0501 12/02/19 1745 12/04/19 1011 12/05/19 0511  NA 138 134* 136 140 141  K 3.7 4.4 5.8* 4.8 4.5  CL 96* 93* 94* 96* 97*  CO2 31 31 32 36* 32  GLUCOSE 100* 103* 98 106* 101*  BUN 16 14 27* 30* 32*  CREATININE 0.81 1.02 0.90 0.91 0.96  CALCIUM 7.3* 7.1* 7.3* 7.4* 7.3*  MG 2.0 1.8 2.0  --  2.0  PHOS 3.9 4.5 6.2* 5.1* 5.3*    ABG: Recent Labs  Lab 11/29/19 1835 11/29/19 2224 11/30/19 0348 11/30/19 0920 12/01/19 1038  PHART 7.473* 7.451* 7.388 7.429 7.489*  PCO2ART 49.4* 51.8* 59.0* 53.9* 44.1  PO2ART 45.5* 48.3* 53.3* 53.6* 161*  HCO3 35.8* 35.7* 34.9* 35.1* 33.0*  O2SAT 80.2 81.6 84.0 85.5 99.3    Liver Function Tests: Recent Labs  Lab 11/29/19 0819 12/01/19 0501 12/02/19 1745 12/04/19 1011 12/05/19 0511  ALBUMIN 1.4* 1.3*  1.4* 1.3* 1.3*   No results for input(s): LIPASE, AMYLASE in the last 168 hours. Recent Labs  Lab 12/05/19 0511  AMMONIA 69*    CBC: Recent Labs  Lab 11/29/19 0819 12/01/19 0501 12/01/19 1159 12/02/19 2055 12/05/19 0511  WBC 10.3 10.1 9.8 10.3 6.5  HGB 7.7* 7.2* 7.7* 8.1* 6.4*  HCT 25.3* 22.0* 25.7* 25.6* 21.0*  MCV 92.7 90.2 93.5 90.5 92.5  PLT 132* 137* 126* 141* 81*    Cardiac Enzymes: No results for input(s): CKTOTAL, CKMB, CKMBINDEX, TROPONINI in the last 168 hours.  BNP (last 3 results) No results for input(s): BNP in the last 8760 hours.  ProBNP (last 3 results) No results for input(s): PROBNP in the last 8760 hours.  Radiological Exams: CT ABDOMEN PELVIS W CONTRAST  Result Date: 12/04/2019 CLINICAL DATA:  Intra-abdominal abscess. EXAM: CT ABDOMEN AND PELVIS WITH CONTRAST TECHNIQUE: Multidetector CT imaging of the abdomen and pelvis was performed using the standard protocol following bolus administration of intravenous contrast. CONTRAST:  OMNIPAQUE IOHEXOL 300 MG/ML  SOLN COMPARISON:  Radiography yesterday.  CT 11/01/2019. FINDINGS: Lower chest: Large bilateral pleural effusions layering dependently with dependent pulmonary atelectasis. Abnormal density in the non dependent lung could be edema or pneumonia. Hepatobiliary: No liver parenchymal abnormality. No calcified gallstones or evidence of gallbladder inflammation. Pancreas: Normal Spleen: Normal Adrenals/Urinary Tract: Adrenal  glands are normal. No significant renal abnormality. Small cysts. No hydronephrosis. Foley catheter in bladder. Stomach/Bowel: Rectal tube in place. Nasogastric tube in place. No visible gastrostomy tube presently. No complication seen at the previous gastrostomy approach. No sign of small bowel obstruction. Fluid stool in the colon without evidence of primary colon pathology. Vascular/Lymphatic: Aortic atherosclerosis. No aneurysm. IVC is normal. No retroperitoneal adenopathy.  Reproductive: Normal Other: Some free fluid in the abdomen most notable along the liver edge and in the pelvic cul-de-sac. Distribution appears free, without obvious loculation or discernible abscess. This fluid could possibly be infected but there is no specific imaging sign of that. Musculoskeletal: Normal IMPRESSION: 1. Large bilateral pleural effusions layering dependently with dependent pulmonary atelectasis. Abnormal density in the non dependent lung could be edema or pneumonia. 2. Some free fluid in the abdomen most notable along the liver edge and in the pelvic cul-de-sac. Distribution appears free, without obvious loculation or discernible abscess. This fluid could possibly be infected but there is no specific imaging sign of that. Aortic Atherosclerosis (ICD10-I70.0). Electronically Signed   By: Paulina Fusi M.D.   On: 12/04/2019 15:55   DG CHEST PORT 1 VIEW  Result Date: 12/04/2019 CLINICAL DATA:  Right PICC line placement. EXAM: PORTABLE CHEST 1 VIEW COMPARISON:  Radiograph earlier, same date. FINDINGS: The tracheostomy tube is stable.  The NG tube is stable. New right-sided PICC line tip is in the right atrium, approximately 10 cm below the carina. This could be retracted 5 cm. Persistent diffuse interstitial and nodular airspace process. Suspect bilateral pleural effusions. IMPRESSION: 1. New right-sided PICC line tip is in the right atrium. This could be retracted 5 cm. 2. Persistent diffuse interstitial and nodular airspace process. Electronically Signed   By: Rudie Meyer M.D.   On: 12/04/2019 12:39   DG CHEST PORT 1 VIEW  Result Date: 12/04/2019 CLINICAL DATA:  Bilateral pulmonary infiltrates. EXAM: PORTABLE CHEST 1 VIEW COMPARISON:  12/01/2019.  11/30/2019 P FINDINGS: Tracheostomy and NG tube in stable position. Heart size stable. Diffuse severe bilateral pulmonary infiltrates/edema again noted. Small bilateral pleural effusions again noted. Lungs noted on prior exam. No pneumothorax.  IMPRESSION: 1. Tracheostomy and NG tube in stable position. 2. Diffuse severe bilateral pulmonary infiltrates/edema again noted. Small bilateral pleural effusions again noted. Similar findings noted on prior exam. Electronically Signed   By: Maisie Fus  Register   On: 12/04/2019 07:45   DG Abd Portable 1V  Result Date: 12/03/2019 CLINICAL DATA:  NG placement EXAM: PORTABLE ABDOMEN - 1 VIEW COMPARISON:  11/28/2019 FINDINGS: NG tube is pulled back from the prior study. The tip is now in the region the gastric antrum and the side hole in the body of the stomach. No obstructed bowel loops. IMPRESSION: NG tip in the region of the gastric antrum. Electronically Signed   By: Marlan Palau M.D.   On: 12/03/2019 15:27    Assessment/Plan Active Problems:   Acute on chronic respiratory failure with hypoxia (HCC)   Chronic atrial fibrillation (HCC)   Seizure disorder (HCC)   Metabolic encephalopathy   COPD, severe (HCC)   1. Acute on chronic respiratory failure with hypoxia  patient continues to be unable to wean remains on full support settings as above.  We will continue aggressive pulmonary toilet supportive measures and continue to wean as patient can tolerate. 2. Chronic atrial fibrillation rate is controlled 3. Seizure disorder no active seizures noted 4. Severe COPD medical management 5. Metabolic encephalopathy supportive care no changes are noted  I have personally seen and evaluated the patient, evaluated laboratory and imaging results, formulated the assessment and plan and placed orders. The Patient requires high complexity decision making with multiple systems involvement.  Rounds were done with the Respiratory Therapy Director and Staff therapists and discussed with nursing staff also.  Allyne Gee, MD [facility] Pulmonary Critical Care Medicine Sleep Medicine

## 2019-12-06 ENCOUNTER — Other Ambulatory Visit (HOSPITAL_COMMUNITY): Payer: BC Managed Care – PPO

## 2019-12-06 DIAGNOSIS — I482 Chronic atrial fibrillation, unspecified: Secondary | ICD-10-CM | POA: Diagnosis not present

## 2019-12-06 DIAGNOSIS — J449 Chronic obstructive pulmonary disease, unspecified: Secondary | ICD-10-CM | POA: Diagnosis not present

## 2019-12-06 DIAGNOSIS — J9621 Acute and chronic respiratory failure with hypoxia: Secondary | ICD-10-CM | POA: Diagnosis not present

## 2019-12-06 DIAGNOSIS — G9341 Metabolic encephalopathy: Secondary | ICD-10-CM | POA: Diagnosis not present

## 2019-12-06 HISTORY — PX: IR GASTROSTOMY TUBE MOD SED: IMG625

## 2019-12-06 HISTORY — PX: IR REPLC GASTRO/COLONIC TUBE PERCUT W/FLUORO: IMG2333

## 2019-12-06 LAB — CULTURE, BLOOD (ROUTINE X 2)
Culture: NO GROWTH
Culture: NO GROWTH

## 2019-12-06 LAB — RENAL FUNCTION PANEL
Albumin: 1.2 g/dL — ABNORMAL LOW (ref 3.5–5.0)
Anion gap: 8 (ref 5–15)
BUN: 27 mg/dL — ABNORMAL HIGH (ref 6–20)
CO2: 35 mmol/L — ABNORMAL HIGH (ref 22–32)
Calcium: 7.5 mg/dL — ABNORMAL LOW (ref 8.9–10.3)
Chloride: 98 mmol/L (ref 98–111)
Creatinine, Ser: 0.97 mg/dL (ref 0.61–1.24)
GFR, Estimated: >60 mL/min (ref >60)
Glucose, Bld: 132 mg/dL — ABNORMAL HIGH (ref 70–99)
Phosphorus: 6.1 mg/dL — ABNORMAL HIGH (ref 2.5–4.6)
Potassium: 5.1 mmol/L (ref 3.5–5.1)
Sodium: 141 mmol/L (ref 135–145)

## 2019-12-06 LAB — PREPARE RBC (CROSSMATCH)

## 2019-12-06 LAB — CBC WITH DIFFERENTIAL/PLATELET
Abs Immature Granulocytes: 0.11 10*3/uL — ABNORMAL HIGH (ref 0.00–0.07)
Basophils Absolute: 0 10*3/uL (ref 0.0–0.1)
Basophils Relative: 0 %
Eosinophils Absolute: 0 10*3/uL (ref 0.0–0.5)
Eosinophils Relative: 0 %
HCT: 22.9 % — ABNORMAL LOW (ref 39.0–52.0)
Hemoglobin: 7 g/dL — ABNORMAL LOW (ref 13.0–17.0)
Immature Granulocytes: 2 %
Lymphocytes Relative: 6 %
Lymphs Abs: 0.4 10*3/uL — ABNORMAL LOW (ref 0.7–4.0)
MCH: 28.6 pg (ref 26.0–34.0)
MCHC: 30.6 g/dL (ref 30.0–36.0)
MCV: 93.5 fL (ref 80.0–100.0)
Monocytes Absolute: 0.1 10*3/uL (ref 0.1–1.0)
Monocytes Relative: 2 %
Neutro Abs: 6.8 10*3/uL (ref 1.7–7.7)
Neutrophils Relative %: 90 %
Platelets: 54 10*3/uL — ABNORMAL LOW (ref 150–400)
RBC: 2.45 MIL/uL — ABNORMAL LOW (ref 4.22–5.81)
RDW: 15.5 % (ref 11.5–15.5)
WBC: 7.5 10*3/uL (ref 4.0–10.5)
nRBC: 0 % (ref 0.0–0.2)

## 2019-12-06 LAB — VALPROIC ACID LEVEL: Valproic Acid Lvl: 58 ug/mL (ref 50.0–100.0)

## 2019-12-06 LAB — PROTIME-INR
INR: 1.2 (ref 0.8–1.2)
Prothrombin Time: 14.6 seconds (ref 11.4–15.2)

## 2019-12-06 MED ORDER — MIDAZOLAM HCL 2 MG/2ML IJ SOLN
INTRAMUSCULAR | Status: AC
  Filled 2019-12-06: qty 2

## 2019-12-06 MED ORDER — LIDOCAINE VISCOUS HCL 2 % MT SOLN
OROMUCOSAL | Status: AC | PRN
  Administered 2019-12-06: 15 mL via OROMUCOSAL

## 2019-12-06 MED ORDER — IOHEXOL 300 MG/ML  SOLN
50.0000 mL | Freq: Once | INTRAMUSCULAR | Status: AC | PRN
  Administered 2019-12-06: 10 mL

## 2019-12-06 MED ORDER — CEFAZOLIN SODIUM-DEXTROSE 2-4 GM/100ML-% IV SOLN
2.0000 g | INTRAVENOUS | Status: DC
  Filled 2019-12-06 (×3): qty 100

## 2019-12-06 MED ORDER — LIDOCAINE VISCOUS HCL 2 % MT SOLN
OROMUCOSAL | Status: AC
  Filled 2019-12-06: qty 15

## 2019-12-06 MED ORDER — LIDOCAINE HCL (PF) 1 % IJ SOLN
INTRAMUSCULAR | Status: AC
  Filled 2019-12-06: qty 30

## 2019-12-06 MED ORDER — FENTANYL CITRATE (PF) 100 MCG/2ML IJ SOLN
INTRAMUSCULAR | Status: AC
  Filled 2019-12-06: qty 2

## 2019-12-06 MED ORDER — CEFAZOLIN SODIUM-DEXTROSE 1-4 GM/50ML-% IV SOLN
INTRAVENOUS | Status: AC | PRN
  Administered 2019-12-06: 2 g via INTRAVENOUS

## 2019-12-06 NOTE — Progress Notes (Signed)
PROGRESS NOTE    Connor Roach  EQA:[id number] DOB: [birth year] DOA: 10/30/2019  Brief Narrative:  Connor Roach is an 58 y.o. male who was [facility].  He has a medical history significant of bipolar disorder, polysubstance abuse.  He was initially admitted to the acute hospital on 10/13/2019 due to encephalopathy secondary to medication toxicity.  He was found to have acute respiratory failure with hypercapnia.  He was initially unresponsive and had to be intubated.  Patient at that time also had elevated WBC count of 26.9 and creatinine of 1.52.  Urine toxicology screen was positive for cocaine and benzodiazepines.  He was treated for possible sepsis, respiratory failure.  Per records from the outside facility he developed angioedema which was treated and he also had paroxysmal atrial fibrillation and was treated.  Patient's hospital course complicated by seizures and developed on 10/16/2019 likely secondary to withdrawal.  He was placed on seizure medications.  He had multiple electrolyte abnormalities that were corrected.  He had tracheostomy as well as PEG tube placement.  After admission here he was found to have fever and leukocytosis.  He was started on empiric IV vancomycin, cefepime. Patient also was found to have leakage through his PEG tube?  CT of the abdomen and pelvis without contrast was done which showed that the gastrostomy tube pulled back into the abdominal wall.  No bowel obstruction.  Bibasilar atelectasis was noted. Last week patient noted to have fever again with T-max 102.  Blood cultures from 11/20/2019 did not show any growth.  However, sputum cultures from 11/20/2019 showed methicillin-resistant staph aureus.  Few stenotrophomonas.  He was started back on vancomycin.  However, he continued to have fevers treated with empiric fluconazole.  He also received treatment with Levaquin but had fevers while on the Levaquin therefore treated with Bactrim for the  stenotrophomonas.  Blood cultures from 11/20/2019 showed staph capitis. Repeat cultures from 11/25/2019 did not show any growth. 12/06/2019: He had tracheal aspirate cultures collected on 12/01/2019 which again showed few MRSA, few stenotrophomonas.  However, he is afebrile without leukocytosis.  He also started having thrombocytopenia.  He is currently on 65% FiO2, 7 of PEEP.  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  His previous respiratory culture showed MRSA, stenotrophomonas.  He has received treatment with multiple rounds of antibiotics including IV vancomycin, cefepime, Levaquin, Bactrim.  He remains on the ventilator at 65% FiO2, 7 of PEEP.  He is currently afebrile without leukocytosis.  Completing antibiotics.  Pulmonary following. He is however high risk for aspiration and aspiration pneumonia.  Although repeat respiratory cultures from 12/01/2019 showing few MRSA, few stenotrophomonas he is currently afebrile without leukocytosis, possible colonization.  He is completing antibiotics today.  Continue to monitor after today.  He is also developing thrombocytopenia etiology unclear but possibility from the antibiotics.  Therefore reluctant to extend antibiotics.  If his respiratory status worsens or not improving, would recommend repeat chest imaging preferably chest CT to better evaluate which could be done without contrast if concern for renal compromise.  Pneumonia with  MRSA, stenotrophomonas: He already completed treatment with multiple rounds of antibiotics including IV  vancomycin, Bactrim.  Although he remains on the ventilator he is afebrile without any leukocytosis.  He is completing antibiotics today.  Continue to monitor.  As mentioned above, he is at risk for aspiration pneumonia.  If his respiratory status worsens would recommend repeat respiratory imaging preferably chest CT and repeat respiratory cultures.  Systemic inflammatory response syndrome: He received treatment with multiple antibiotics. However, after the antibiotics stopped he started having fevers again.  In the interim he had PEG tube site infection. CT imaging showed PEG tube was displaced into the abdominal wall.  Previous PEG tube removed, now placed again.  He is at risk for sepsis.  Continue to monitor closely.  Completing antibiotics.  Thrombocytopenia: Patient has been having a drop in his platelet count.  Unfortunately he has required multiple courses of antibiotics over the past several days.  At this time exact etiology for thrombocytopenia unclear but cannot rule out antibiotic etiology.  Therefore at this time reluctant to extend antibiotics unless he has fevers or worsening leukocytosis.  Continue to monitor counts closely.  Further management of thrombocytopenia per the primary team.  Atrial fibrillation: Continue medication and management per the primary team.  Seizure: There is a mention of seizures at the outside facility could be possibly secondary to withdrawal. Patient got EEG here which showed encephalopathy like picture.   Further management per primary team.  Encephalopathy: Continue supportive management per the primary team.  Dysphagia: Due to his dysphagia he is high risk for aspiration and aspiration pneumonia despite being on antibiotics.  Polysubstance abuse: Blood cultures do not show any growth. Continue supportive management per the primary  team.  Sacrococcygeal deep tissue pressure injury/left ear pressure ulcer unspecified stage: Continue local wound care.  Electrolyte abnormalities with hypokalemia: Continue to monitor, replacement per the primary team.  Due to his complex medical problems he is high risk for worsening and decompensation. Plan of care discussed with the primary team and pharmacy.   Subjective: He is more awake but not following commands.  Continues to remain encephalopathic.    Objective: Vitals: Temperature 98.2, pulse 82, respiratory rate 24, blood pressure 140/71, pulse oximetry 96% on 65% FiO2, PEEP of 7  Examination: General exam: Ill-appearing male, opening eyes but not following commands at this time. Remains encephalopathic. Eyes: PERLA, EOMI  ENMT: external ears and nose appear normal, poor dentition Neck: Has trach in place CVS: S1-S2 Respiratory:  Occasional rhonchi, no wheezing Abdomen: Soft, positive bowel sounds Musculoskeletal: edema Neuro: He is not following commands.  Unable to do neurologic exam at this time. Psych: Unable to assess Skin: no rashes   Data Reviewed: I have personally reviewed following labs and imaging studies  CBC: Recent Labs  Lab 12/01/19 0501 12/01/19 1159 12/02/19 2055 12/05/19 0511 12/06/19 0351  WBC 10.1 9.8 10.3 6.5 7.5  NEUTROABS  --   --   --   --  6.8  HGB 7.2* 7.7* 8.1* 6.4* 7.0*  HCT 22.0* 25.7* 25.6* 21.0* 22.9*  MCV 90.2 93.5 90.5 92.5 93.5  PLT 137* 126* 141* 81* 54*    Basic Metabolic Panel: Recent Labs  Lab 12/01/19 0501 12/02/19 1745 12/04/19 1011 12/05/19 0511 12/06/19 0351  NA 134* 136 140 141 141  K 4.4 5.8* 4.8 4.5 5.1  CL 93* 94* 96* 97* 98  CO2 31 32 36* 32 35*  GLUCOSE 103* 98 106* 101* 132*  BUN 14 27* 30* 32* 27*  CREATININE 1.02 0.90 0.91 0.96 0.97  CALCIUM 7.1* 7.3* 7.4*  7.3* 7.5*  MG 1.8 2.0  --  2.0  --   PHOS 4.5 6.2* 5.1* 5.3* 6.1*    GFR: CrCl cannot be calculated (Unknown ideal weight.).  Liver  Function Tests: Recent Labs  Lab 12/01/19 0501 12/02/19 1745 12/04/19 1011 12/05/19 0511 12/06/19 0351  ALBUMIN 1.3* 1.4* 1.3* 1.3* 1.2*    CBG: No results for input(s): GLUCAP in the last 168 hours.   Recent Results (from the past 240 hour(s))  Culture, respiratory (non-expectorated)     Status: None   Collection Time: 12/01/19 11:43 AM   Specimen: Tracheal Aspirate; Respiratory  Result Value Ref Range Status   Specimen Description TRACHEAL ASPIRATE  Final   Special Requests NONE  Final   Gram Stain   Final    ABUNDANT WBC PRESENT,BOTH PMN AND MONONUCLEAR RARE SQUAMOUS EPITHELIAL CELLS PRESENT FEW GRAM POSITIVE COCCI IN PAIRS RARE GRAM POSITIVE RODS RARE GRAM NEGATIVE COCCOBACILLI Performed at [facility], [address] [place], [place] [address]    Culture   Final    FEW METHICILLIN RESISTANT STAPHYLOCOCCUS AUREUS FEW STENOTROPHOMONAS MALTOPHILIA    Report Status 12/04/2019 FINAL  Final   Organism ID, Bacteria METHICILLIN RESISTANT STAPHYLOCOCCUS AUREUS  Final   Organism ID, Bacteria STENOTROPHOMONAS MALTOPHILIA  Final      Susceptibility   Methicillin resistant staphylococcus aureus - MIC*    CIPROFLOXACIN >=8 RESISTANT Resistant     ERYTHROMYCIN >=8 RESISTANT Resistant     GENTAMICIN >=16 RESISTANT Resistant     OXACILLIN >=4 RESISTANT Resistant     TETRACYCLINE >=16 RESISTANT Resistant     VANCOMYCIN 1 SENSITIVE Sensitive     TRIMETH/SULFA 80 RESISTANT Resistant     CLINDAMYCIN >=8 RESISTANT Resistant     RIFAMPIN 1 SENSITIVE Sensitive     Inducible Clindamycin NEGATIVE Sensitive     * FEW METHICILLIN RESISTANT STAPHYLOCOCCUS AUREUS   Stenotrophomonas maltophilia - MIC*    LEVOFLOXACIN 0.25 SENSITIVE Sensitive     TRIMETH/SULFA <=20 SENSITIVE Sensitive     * FEW STENOTROPHOMONAS MALTOPHILIA  Culture, Urine     Status: Abnormal   Collection Time: 12/01/19 11:44 AM   Specimen: Urine, Random  Result Value Ref Range Status   Specimen Description  URINE, RANDOM  Final   Special Requests NONE  Final   Culture (A)  Final    <10,000 COLONIES/mL INSIGNIFICANT GROWTH Performed at [facility], [address] [place], [place] [address]    Report Status 12/02/2019 FINAL  Final  Culture, blood (routine x 2)     Status: None   Collection Time: 12/01/19 12:20 PM   Specimen: BLOOD LEFT HAND  Result Value Ref Range Status   Specimen Description BLOOD LEFT HAND  Final   Special Requests   Final    BOTTLES DRAWN AEROBIC ONLY Blood Culture results may not be optimal due to an inadequate volume of blood received in culture bottles   Culture   Final    NO GROWTH 5 DAYS Performed at [facility], [address] [place], [place] [address]    Report Status 12/06/2019 FINAL  Final  Culture, blood (routine x 2)     Status: None   Collection Time: 12/01/19 12:20 PM   Specimen: BLOOD RIGHT HAND  Result Value Ref Range Status   Specimen Description BLOOD RIGHT HAND  Final   Special Requests   Final    BOTTLES DRAWN AEROBIC ONLY Blood Culture results may not be optimal due to an  inadequate volume of blood received in culture bottles   Culture   Final    NO GROWTH 5 DAYS Performed at [facility], [address], [place], [place] [address]    Report Status 12/06/2019 FINAL  Final  SARS CORONAVIRUS 2 (TAT 6-24 HRS) Nasopharyngeal Nasopharyngeal Swab     Status: None   Collection Time: 12/04/19  9:36 AM   Specimen: Nasopharyngeal Swab  Result Value Ref Range Status   SARS Coronavirus 2 NEGATIVE NEGATIVE Final    Comment: (NOTE) SARS-CoV-2 target nucleic acids are NOT DETECTED.  The SARS-CoV-2 RNA is generally detectable in upper and lower respiratory specimens during the acute phase of infection. Negative results do not preclude SARS-CoV-2 infection, do not rule out co-infections with other pathogens, and should not be used as the sole basis for treatment or other patient management decisions. Negative results must be  combined with clinical observations, patient history, and epidemiological information. The expected result is Negative.  Fact Sheet for Patients: HairSlick.no  Fact Sheet for Healthcare Providers: quierodirigir.com  This test is not yet approved or cleared by the [place] FDA and  has been authorized for detection and/or diagnosis of SARS-CoV-2 by FDA under an Emergency Use Authorization (EUA). This EUA will remain  in effect (meaning this test can be used) for the duration of the COVID-19 declaration under Se ction 564(b)(1) of the Act, 21 U.S.C. section 360bbb-3(b)(1), unless the authorization is terminated or revoked sooner.  Performed at [facility], [address], [place], [place] [address]        Radiology Studies: IR GASTROSTOMY TUBE MOD SED  Result Date: 12/06/2019 INDICATION: 59 year old male with a history of prior percutaneous gastrostomy now displaced EXAM: PERC PLACEMENT GASTROSTOMY MEDICATIONS: 2 g Ancef; Antibiotics were administered within 1 hour of the procedure. ANESTHESIA/SEDATION: None CONTRAST:  23mL OMNIPAQUE IOHEXOL 300 MG/ML SOLN - administered into the gastric lumen. FLUOROSCOPY TIME:  Fluoroscopy Time: 0 minutes 12 seconds (1 mGy). COMPLICATIONS: None PROCEDURE: Informed written consent was obtained from the patient and the patient's family after a thorough discussion of the procedural risks, benefits and alternatives. All questions were addressed. Maximal Sterile Barrier Technique was utilized including caps, mask, sterile gowns, sterile gloves, sterile drape, hand hygiene and skin antiseptic. A timeout was performed prior to the initiation of the procedure. The epigastrium was prepped with Betadine in a sterile fashion, and a sterile drape was applied covering the operative field. A sterile gown and sterile gloves were used for the procedure. Contrast was injected through the indwelling tract,  opacified stomach lumen. Kumpe the catheter was used to navigate into the stomach. Amplatz wire was placed through the Kumpe the catheter and the Kumpe the catheter was removed. Eighteen [place] dilator was placed confirming the size of the tract. We elected to place an 62 French balloon retention gastrostomy tube, which was advanced into the stomach on the wire. 8 cc saline used to inflate the balloon. Wire was removed. Finals image was stored Patient tolerated the procedure well and remained hemodynamically stable throughout. No complications were encountered and no significant blood loss encountered. IMPRESSION: Status post fluoroscopic rescue of a displaced percutaneous gastrostomy tube, with new 18 French balloon retention gastrostomy placed. Signed, Yvone Neu. Loreta Ave, DO [facility] Electronically Signed   By: Gilmer Mor D.O.   On: 12/06/2019 14:31   DG CHEST PORT 1 VIEW  Result Date: 12/06/2019 CLINICAL DATA:  Pneumothorax. EXAM: PORTABLE CHEST 1 VIEW  COMPARISON:  12/05/2019. FINDINGS: Tracheostomy tube, NG tube, right PICC line stable position. Heart size stable. Diffuse bilateral airspace disease again noted without interim change. Small left pleural effusion again noted. No pneumothorax. IMPRESSION: 1. Lines and tubes in stable position. 2. Diffuse bilateral airspace disease again noted without interim change. Small left pleural effusion again noted. No pneumothorax. Electronically Signed   By: Maisie Fus  Register   On: 12/06/2019 07:37   DG Chest Port 1 View  Result Date: 12/05/2019 CLINICAL DATA:  Status post right thoracentesis today. EXAM: PORTABLE CHEST 1 VIEW COMPARISON:  Single-view of the chest 12/04/2019. FINDINGS: Support tubes and lines are unchanged. Right pleural effusion is decreased after thoracentesis. No pneumothorax. Small left effusion and extensive bilateral airspace disease are unchanged. IMPRESSION: Negative for  pneumothorax after right thoracentesis. No change in extensive bilateral airspace disease and a small left pleural effusion Electronically Signed   By: Drusilla Kanner M.D.   On: 12/05/2019 15:28   IR THORACENTESIS ASP PLEURAL SPACE W/IMG GUIDE  Result Date: 12/05/2019 INDICATION: Patient with history of respiratory failure,COPD, encephalopathy, pneumonia, bilateral pleural effusions; request received for therapeutic right thoracentesis. EXAM: ULTRASOUND GUIDED THERAPEUTIC RIGHT THORACENTESIS MEDICATIONS: 1% lidocaine to skin/SQ tissue COMPLICATIONS: None immediate. PROCEDURE: An ultrasound guided thoracentesis was thoroughly discussed with the patient's spouse and questions answered. The benefits, risks, alternatives and complications were also discussed. The patient understands and wishes to proceed with the procedure. Written consent was obtained. Ultrasound was performed to localize and mark an adequate pocket of fluid in the right chest. The area was then prepped and draped in the normal sterile fashion. 1% Lidocaine was used for local anesthesia. Under ultrasound guidance a 6 Fr Safe-T-Centesis catheter was introduced. Thoracentesis was performed. The catheter was removed and a dressing applied. FINDINGS: A total of approximately 700 cc of yellow fluid was removed. IMPRESSION: Successful ultrasound guided therapeutic right thoracentesis yielding 700 cc of pleural fluid. Read by: Jeananne Rama, PA-C Electronically Signed   By: Marliss Coots MD   On: 12/05/2019 15:01    Scheduled Meds: Please see MAR   Vonzella Nipple, MD  12/06/2019, 5:09 PM

## 2019-12-06 NOTE — Progress Notes (Signed)
Pulmonary Critical Care Medicine [facility] [place]   PULMONARY CRITICAL CARE SERVICE  PROGRESS NOTE  Date of Service: 12/06/2019  Connor Roach  OIB:[id number]  DOB: 1960-06-19   DOA: 10/30/2019  Referring Physician: Carron Curie, MD  HPI: Connor Roach is a 59 y.o. male seen for follow up of Acute on Chronic Respiratory Failure.  Patient had thoracentesis done yesterday approximately removal of 700 cc of fluid oxygen requirements have actually gone up right now is on 70% FiO2 but saturations are 100%  Medications: Reviewed on Rounds  Physical Exam:  Vitals: Temperature is 99.3 pulse 72 respiratory 20 blood pressure 115/55 saturations 100%  Ventilator Settings on assist control FiO2 75% tidal volume 400 PEEP 7  . General: Comfortable at this time . Eyes: Grossly normal lids, irises & conjunctiva . ENT: grossly tongue is normal . Neck: no obvious mass . Cardiovascular: S1 S2 normal no gallop . Respiratory: Scattered rhonchi expansion equal . Abdomen: soft . Skin: no rash seen on limited exam . Musculoskeletal: not rigid . Psychiatric:unable to assess . Neurologic: no seizure no involuntary movements         Lab Data:   Basic Metabolic Panel: Recent Labs  Lab 12/01/19 0501 12/02/19 1745 12/04/19 1011 12/05/19 0511 12/06/19 0351  NA 134* 136 140 141 141  K 4.4 5.8* 4.8 4.5 5.1  CL 93* 94* 96* 97* 98  CO2 31 32 36* 32 35*  GLUCOSE 103* 98 106* 101* 132*  BUN 14 27* 30* 32* 27*  CREATININE 1.02 0.90 0.91 0.96 0.97  CALCIUM 7.1* 7.3* 7.4* 7.3* 7.5*  MG 1.8 2.0  --  2.0  --   PHOS 4.5 6.2* 5.1* 5.3* 6.1*    ABG: Recent Labs  Lab 11/29/19 1835 11/29/19 2224 11/30/19 0348 11/30/19 0920 12/01/19 1038  PHART 7.473* 7.451* 7.388 7.429 7.489*  PCO2ART 49.4* 51.8* 59.0* 53.9* 44.1  PO2ART 45.5* 48.3* 53.3* 53.6* 161*  HCO3 35.8* 35.7* 34.9* 35.1* 33.0*  O2SAT 80.2 81.6 84.0 85.5 99.3    Liver Function Tests: Recent Labs  Lab 12/01/19 0501  12/02/19 1745 12/04/19 1011 12/05/19 0511 12/06/19 0351  ALBUMIN 1.3* 1.4* 1.3* 1.3* 1.2*   No results for input(s): LIPASE, AMYLASE in the last 168 hours. Recent Labs  Lab 12/05/19 0511  AMMONIA 69*    CBC: Recent Labs  Lab 12/01/19 0501 12/01/19 1159 12/02/19 2055 12/05/19 0511 12/06/19 0351  WBC 10.1 9.8 10.3 6.5 7.5  NEUTROABS  --   --   --   --  6.8  HGB 7.2* 7.7* 8.1* 6.4* 7.0*  HCT 22.0* 25.7* 25.6* 21.0* 22.9*  MCV 90.2 93.5 90.5 92.5 93.5  PLT 137* 126* 141* 81* 54*    Cardiac Enzymes: No results for input(s): CKTOTAL, CKMB, CKMBINDEX, TROPONINI in the last 168 hours.  BNP (last 3 results) No results for input(s): BNP in the last 8760 hours.  ProBNP (last 3 results) No results for input(s): PROBNP in the last 8760 hours.  Radiological Exams: CT ABDOMEN PELVIS W CONTRAST  Result Date: 12/04/2019 CLINICAL DATA:  Intra-abdominal abscess. EXAM: CT ABDOMEN AND PELVIS WITH CONTRAST TECHNIQUE: Multidetector CT imaging of the abdomen and pelvis was performed using the standard protocol following bolus administration of intravenous contrast. CONTRAST:  OMNIPAQUE IOHEXOL 300 MG/ML  SOLN COMPARISON:  Radiography yesterday.  CT 11/01/2019. FINDINGS: Lower chest: Large bilateral pleural effusions layering dependently with dependent pulmonary atelectasis. Abnormal density in the non dependent lung could be edema or pneumonia. Hepatobiliary: No  liver parenchymal abnormality. No calcified gallstones or evidence of gallbladder inflammation. Pancreas: Normal Spleen: Normal Adrenals/Urinary Tract: Adrenal glands are normal. No significant renal abnormality. Small cysts. No hydronephrosis. Foley catheter in bladder. Stomach/Bowel: Rectal tube in place. Nasogastric tube in place. No visible gastrostomy tube presently. No complication seen at the previous gastrostomy approach. No sign of small bowel obstruction. Fluid stool in the colon without evidence of primary colon pathology.  Vascular/Lymphatic: Aortic atherosclerosis. No aneurysm. IVC is normal. No retroperitoneal adenopathy. Reproductive: Normal Other: Some free fluid in the abdomen most notable along the liver edge and in the pelvic cul-de-sac. Distribution appears free, without obvious loculation or discernible abscess. This fluid could possibly be infected but there is no specific imaging sign of that. Musculoskeletal: Normal IMPRESSION: 1. Large bilateral pleural effusions layering dependently with dependent pulmonary atelectasis. Abnormal density in the non dependent lung could be edema or pneumonia. 2. Some free fluid in the abdomen most notable along the liver edge and in the pelvic cul-de-sac. Distribution appears free, without obvious loculation or discernible abscess. This fluid could possibly be infected but there is no specific imaging sign of that. Aortic Atherosclerosis (ICD10-I70.0). Electronically Signed   By: Paulina Fusi M.D.   On: 12/04/2019 15:55   DG CHEST PORT 1 VIEW  Result Date: 12/06/2019 CLINICAL DATA:  Pneumothorax. EXAM: PORTABLE CHEST 1 VIEW COMPARISON:  12/05/2019. FINDINGS: Tracheostomy tube, NG tube, right PICC line stable position. Heart size stable. Diffuse bilateral airspace disease again noted without interim change. Small left pleural effusion again noted. No pneumothorax. IMPRESSION: 1. Lines and tubes in stable position. 2. Diffuse bilateral airspace disease again noted without interim change. Small left pleural effusion again noted. No pneumothorax. Electronically Signed   By: Maisie Fus  Register   On: 12/06/2019 07:37   DG Chest Port 1 View  Result Date: 12/05/2019 CLINICAL DATA:  Status post right thoracentesis today. EXAM: PORTABLE CHEST 1 VIEW COMPARISON:  Single-view of the chest 12/04/2019. FINDINGS: Support tubes and lines are unchanged. Right pleural effusion is decreased after thoracentesis. No pneumothorax. Small left effusion and extensive bilateral airspace disease are  unchanged. IMPRESSION: Negative for pneumothorax after right thoracentesis. No change in extensive bilateral airspace disease and a small left pleural effusion Electronically Signed   By: Drusilla Kanner M.D.   On: 12/05/2019 15:28   DG CHEST PORT 1 VIEW  Result Date: 12/04/2019 CLINICAL DATA:  Right PICC line placement. EXAM: PORTABLE CHEST 1 VIEW COMPARISON:  Radiograph earlier, same date. FINDINGS: The tracheostomy tube is stable.  The NG tube is stable. New right-sided PICC line tip is in the right atrium, approximately 10 cm below the carina. This could be retracted 5 cm. Persistent diffuse interstitial and nodular airspace process. Suspect bilateral pleural effusions. IMPRESSION: 1. New right-sided PICC line tip is in the right atrium. This could be retracted 5 cm. 2. Persistent diffuse interstitial and nodular airspace process. Electronically Signed   By: Rudie Meyer M.D.   On: 12/04/2019 12:39   IR THORACENTESIS ASP PLEURAL SPACE W/IMG GUIDE  Result Date: 12/05/2019 INDICATION: Patient with history of respiratory failure,COPD, encephalopathy, pneumonia, bilateral pleural effusions; request received for therapeutic right thoracentesis. EXAM: ULTRASOUND GUIDED THERAPEUTIC RIGHT THORACENTESIS MEDICATIONS: 1% lidocaine to skin/SQ tissue COMPLICATIONS: None immediate. PROCEDURE: An ultrasound guided thoracentesis was thoroughly discussed with the patient's spouse and questions answered. The benefits, risks, alternatives and complications were also discussed. The patient understands and wishes to proceed with the procedure. Written consent was obtained. Ultrasound was performed to localize  and mark an adequate pocket of fluid in the right chest. The area was then prepped and draped in the normal sterile fashion. 1% Lidocaine was used for local anesthesia. Under ultrasound guidance a 6 Fr Safe-T-Centesis catheter was introduced. Thoracentesis was performed. The catheter was removed and a dressing  applied. FINDINGS: A total of approximately 700 cc of yellow fluid was removed. IMPRESSION: Successful ultrasound guided therapeutic right thoracentesis yielding 700 cc of pleural fluid. Read by: Jeananne Rama, PA-C Electronically Signed   By: Marliss Coots MD   On: 12/05/2019 15:01    Assessment/Plan Active Problems:   Acute on chronic respiratory failure with hypoxia (HCC)   Chronic atrial fibrillation (HCC)   Seizure disorder (HCC)   Metabolic encephalopathy   COPD, severe (HCC)   1. Acute on chronic respiratory failure hypoxia patient's FiO2 will be decreased because saturations were adequate we will reassess ABG if needed 2. Chronic atrial fibrillation rate controlled at this time 3. Seizure disorder no active seizures noted 4. Metabolic encephalopathy patient is at baseline 5. Severe COPD medical management   I have personally seen and evaluated the patient, evaluated laboratory and imaging results, formulated the assessment and plan and placed orders. The Patient requires high complexity decision making with multiple systems involvement.  Rounds were done with the Respiratory Therapy Director and Staff therapists and discussed with nursing staff also.  Yevonne Pax, MD [facility] Pulmonary Critical Care Medicine Sleep Medicine

## 2019-12-06 NOTE — Procedures (Signed)
Interventional Radiology Procedure Note  Procedure:  Replacement/rescue of displaced gtube New 14F balloon retention gastrostomy. 8cc saline in balloon .  Complications: None  Recommendations:  - Ok to use - Do not submerge - Routine care, with emphasis on dry dressing changes at the skin site, given the prior ostomy bag usage which has denuded the skin   Signed,  Yvone Neu. Loreta Ave, DO

## 2019-12-07 ENCOUNTER — Encounter (HOSPITAL_COMMUNITY): Payer: Self-pay

## 2019-12-07 DIAGNOSIS — J449 Chronic obstructive pulmonary disease, unspecified: Secondary | ICD-10-CM | POA: Diagnosis not present

## 2019-12-07 DIAGNOSIS — I482 Chronic atrial fibrillation, unspecified: Secondary | ICD-10-CM | POA: Diagnosis not present

## 2019-12-07 DIAGNOSIS — G9341 Metabolic encephalopathy: Secondary | ICD-10-CM | POA: Diagnosis not present

## 2019-12-07 DIAGNOSIS — J9621 Acute and chronic respiratory failure with hypoxia: Secondary | ICD-10-CM | POA: Diagnosis not present

## 2019-12-07 LAB — COMPREHENSIVE METABOLIC PANEL
ALT: 14 U/L (ref 0–44)
AST: 22 U/L (ref 15–41)
Albumin: 1.3 g/dL — ABNORMAL LOW (ref 3.5–5.0)
Alkaline Phosphatase: 73 U/L (ref 38–126)
Anion gap: 8 (ref 5–15)
BUN: 30 mg/dL — ABNORMAL HIGH (ref 6–20)
CO2: 35 mmol/L — ABNORMAL HIGH (ref 22–32)
Calcium: 7.7 mg/dL — ABNORMAL LOW (ref 8.9–10.3)
Chloride: 98 mmol/L (ref 98–111)
Creatinine, Ser: 0.85 mg/dL (ref 0.61–1.24)
GFR, Estimated: >60 mL/min (ref >60)
Glucose, Bld: 98 mg/dL (ref 70–99)
Potassium: 4.8 mmol/L (ref 3.5–5.1)
Sodium: 141 mmol/L (ref 135–145)
Total Bilirubin: 0.3 mg/dL (ref 0.3–1.2)
Total Protein: 4.5 g/dL — ABNORMAL LOW (ref 6.5–8.1)

## 2019-12-07 LAB — CBC
HCT: 26.8 % — ABNORMAL LOW (ref 39.0–52.0)
Hemoglobin: 8.2 g/dL — ABNORMAL LOW (ref 13.0–17.0)
MCH: 29 pg (ref 26.0–34.0)
MCHC: 30.6 g/dL (ref 30.0–36.0)
MCV: 94.7 fL (ref 80.0–100.0)
Platelets: 45 10*3/uL — ABNORMAL LOW (ref 150–400)
RBC: 2.83 MIL/uL — ABNORMAL LOW (ref 4.22–5.81)
RDW: 15.1 % (ref 11.5–15.5)
WBC: 6.9 10*3/uL (ref 4.0–10.5)
nRBC: 0.3 % — ABNORMAL HIGH (ref 0.0–0.2)

## 2019-12-07 LAB — BPAM RBC
Blood Product Expiration Date: 202111112359
Blood Product Expiration Date: 202111142359
ISSUE DATE / TIME: 202110201220
ISSUE DATE / TIME: 202110211550
Unit Type and Rh: 7300
Unit Type and Rh: 7300

## 2019-12-07 LAB — DIC (DISSEMINATED INTRAVASCULAR COAGULATION)PANEL
D-Dimer, Quant: 4.84 ug/mL-FEU — ABNORMAL HIGH (ref 0.00–0.50)
Fibrinogen: 380 mg/dL (ref 210–475)
INR: 1.2 (ref 0.8–1.2)
Platelets: 43 10*3/uL — ABNORMAL LOW (ref 150–400)
Prothrombin Time: 14.7 seconds (ref 11.4–15.2)
Smear Review: NONE SEEN
aPTT: 31 seconds (ref 24–36)

## 2019-12-07 LAB — TYPE AND SCREEN
ABO/RH(D): B POS
Antibody Screen: NEGATIVE
Unit division: 0
Unit division: 0

## 2019-12-07 LAB — PATHOLOGIST SMEAR REVIEW

## 2019-12-07 LAB — OCCULT BLOOD X 1 CARD TO LAB, STOOL: Fecal Occult Bld: NEGATIVE

## 2019-12-07 LAB — MAGNESIUM: Magnesium: 2 mg/dL (ref 1.7–2.4)

## 2019-12-07 NOTE — Progress Notes (Addendum)
Pulmonary Critical Care Medicine [facility] [place]   PULMONARY CRITICAL CARE SERVICE  PROGRESS NOTE  Date of Service: 12/07/2019  Connor Roach  GQQ:[id number]  DOB: 25-May-1960   DOA: 10/30/2019  Referring Physician: Carron Curie, MD  HPI: Connor Roach is a 59 y.o. male seen for follow up of Acute on Chronic Respiratory Failure.  Patient remains on pressure support weaning at this time 10/7 FiO2 45% for 4-hour goal satting well time no distress.  Medications: Reviewed on Rounds  Physical Exam:  Vitals: Pulse 82 respirations 16 BP 132/75 O2 sat 100% temp 99.4  Ventilator Settings pressure support 10/7 FiO2 45%  . General: Comfortable at this time . Eyes: Grossly normal lids, irises & conjunctiva . ENT: grossly tongue is normal . Neck: no obvious mass . Cardiovascular: S1 S2 normal no gallop . Respiratory: Coarse breath sounds . Abdomen: soft . Skin: no rash seen on limited exam . Musculoskeletal: not rigid . Psychiatric:unable to assess . Neurologic: no seizure no involuntary movements         Lab Data:   Basic Metabolic Panel: Recent Labs  Lab 12/01/19 0501 12/01/19 0501 12/02/19 1745 12/04/19 1011 12/05/19 0511 12/06/19 0351 12/07/19 0441  NA 134*   < > 136 140 141 141 141  K 4.4   < > 5.8* 4.8 4.5 5.1 4.8  CL 93*   < > 94* 96* 97* 98 98  CO2 31   < > 32 36* 32 35* 35*  GLUCOSE 103*   < > 98 106* 101* 132* 98  BUN 14   < > 27* 30* 32* 27* 30*  CREATININE 1.02   < > 0.90 0.91 0.96 0.97 0.85  CALCIUM 7.1*   < > 7.3* 7.4* 7.3* 7.5* 7.7*  MG 1.8  --  2.0  --  2.0  --  2.0  PHOS 4.5  --  6.2* 5.1* 5.3* 6.1*  --    < > = values in this interval not displayed.    ABG: Recent Labs  Lab 12/01/19 1038  PHART 7.489*  PCO2ART 44.1  PO2ART 161*  HCO3 33.0*  O2SAT 99.3    Liver Function Tests: Recent Labs  Lab 12/02/19 1745 12/04/19 1011 12/05/19 0511 12/06/19 0351 12/07/19 0441  AST  --   --   --   --  22  ALT  --   --   --   --   14  ALKPHOS  --   --   --   --  73  BILITOT  --   --   --   --  0.3  PROT  --   --   --   --  4.5*  ALBUMIN 1.4* 1.3* 1.3* 1.2* 1.3*   No results for input(s): LIPASE, AMYLASE in the last 168 hours. Recent Labs  Lab 12/05/19 0511  AMMONIA 69*    CBC: Recent Labs  Lab 12/01/19 1159 12/02/19 2055 12/05/19 0511 12/06/19 0351 12/07/19 0441  WBC 9.8 10.3 6.5 7.5 6.9  NEUTROABS  --   --   --  6.8  --   HGB 7.7* 8.1* 6.4* 7.0* 8.2*  HCT 25.7* 25.6* 21.0* 22.9* 26.8*  MCV 93.5 90.5 92.5 93.5 94.7  PLT 126* 141* 81* 54* 45*    Cardiac Enzymes: No results for input(s): CKTOTAL, CKMB, CKMBINDEX, TROPONINI in the last 168 hours.  BNP (last 3 results) No results for input(s): BNP in the last 8760 hours.  ProBNP (last 3 results) No  results for input(s): PROBNP in the last 8760 hours.  Radiological Exams: IR Replc Gastro/Colonic Tube Percut W/Fluoro  Result Date: 12/07/2019 INDICATION: 59 year old male with a history of prior percutaneous gastrostomy now displaced  EXAM: PERC PLACEMENT GASTROSTOMY  MEDICATIONS: 2 g Ancef; Antibiotics were administered within 1 hour of the procedure.  ANESTHESIA/SEDATION: None  CONTRAST:  36mL OMNIPAQUE IOHEXOL 300 MG/ML SOLN - administered into the gastric lumen.  FLUOROSCOPY TIME:  Fluoroscopy Time: 0 minutes 12 seconds (1 mGy).  COMPLICATIONS: None  PROCEDURE: Informed written consent was obtained from the patient and the patient's family after a thorough discussion of the procedural risks, benefits and alternatives. All questions were addressed. Maximal Sterile Barrier Technique was utilized including caps, mask, sterile gowns, sterile gloves, sterile drape, hand hygiene and skin antiseptic. A timeout was performed prior to the initiation of the procedure.  The epigastrium was prepped with Betadine in a sterile fashion, and a sterile drape was applied covering the operative field. A sterile gown and sterile gloves were used for the procedure.   Contrast was injected through the indwelling tract, opacified stomach lumen. Kumpe the catheter was used to navigate into the stomach. Amplatz wire was placed through the Kumpe the catheter and the Kumpe the catheter was removed. Eighteen [place] dilator was placed confirming the size of the tract.  We elected to place an 83 French balloon retention gastrostomy tube, which was advanced into the stomach on the wire. 8 cc saline used to inflate the balloon. Wire was removed.  Finals image was stored  Patient tolerated the procedure well and remained hemodynamically stable throughout.  No complications were encountered and no significant blood loss encountered.  IMPRESSION: Status post fluoroscopic rescue of a displaced percutaneous gastrostomy tube, with new 18 French balloon retention gastrostomy placed.  Signed,  Yvone Neu. Loreta Ave, DO  [facility]  [facility]   Electronically Signed   By: Gilmer Mor D.O.   On: 12/06/2019 14:31  DG CHEST PORT 1 VIEW  Result Date: 12/06/2019 CLINICAL DATA:  Pneumothorax. EXAM: PORTABLE CHEST 1 VIEW COMPARISON:  12/05/2019. FINDINGS: Tracheostomy tube, NG tube, right PICC line stable position. Heart size stable. Diffuse bilateral airspace disease again noted without interim change. Small left pleural effusion again noted. No pneumothorax. IMPRESSION: 1. Lines and tubes in stable position. 2. Diffuse bilateral airspace disease again noted without interim change. Small left pleural effusion again noted. No pneumothorax. Electronically Signed   By: Maisie Fus  Register   On: 12/06/2019 07:37   DG Chest Port 1 View  Result Date: 12/05/2019 CLINICAL DATA:  Status post right thoracentesis today. EXAM: PORTABLE CHEST 1 VIEW COMPARISON:  Single-view of the chest 12/04/2019. FINDINGS: Support tubes and lines are unchanged. Right pleural effusion is decreased after thoracentesis. No pneumothorax. Small left effusion and extensive  bilateral airspace disease are unchanged. IMPRESSION: Negative for pneumothorax after right thoracentesis. No change in extensive bilateral airspace disease and a small left pleural effusion Electronically Signed   By: Drusilla Kanner M.D.   On: 12/05/2019 15:28   IR THORACENTESIS ASP PLEURAL SPACE W/IMG GUIDE  Result Date: 12/05/2019 INDICATION: Patient with history of respiratory failure,COPD, encephalopathy, pneumonia, bilateral pleural effusions; request received for therapeutic right thoracentesis. EXAM: ULTRASOUND GUIDED THERAPEUTIC RIGHT THORACENTESIS MEDICATIONS: 1% lidocaine to skin/SQ tissue COMPLICATIONS: None immediate. PROCEDURE: An ultrasound guided thoracentesis was thoroughly discussed with the patient's spouse and questions answered. The benefits, risks, alternatives and complications were also discussed. The patient understands and wishes to proceed with the  procedure. Written consent was obtained. Ultrasound was performed to localize and mark an adequate pocket of fluid in the right chest. The area was then prepped and draped in the normal sterile fashion. 1% Lidocaine was used for local anesthesia. Under ultrasound guidance a 6 Fr Safe-T-Centesis catheter was introduced. Thoracentesis was performed. The catheter was removed and a dressing applied. FINDINGS: A total of approximately 700 cc of yellow fluid was removed. IMPRESSION: Successful ultrasound guided therapeutic right thoracentesis yielding 700 cc of pleural fluid. Read by: Jeananne Rama, PA-C Electronically Signed   By: Marliss Coots MD   On: 12/05/2019 15:01    Assessment/Plan Active Problems:   Acute on chronic respiratory failure with hypoxia (HCC)   Chronic atrial fibrillation (HCC)   Seizure disorder (HCC)   Metabolic encephalopathy   COPD, severe (HCC)   1. Acute on chronic respiratory failure hypoxia patient is weaning today for goal of 4 hours on pressure support continue supportive measures and aggressive  pulmonary toilet. 2. Chronic atrial fibrillation rate controlled at this time 3. Seizure disorder no active seizures noted 4. Metabolic encephalopathy patient is at baseline 5. Severe COPD medical management   I have personally seen and evaluated the patient, evaluated laboratory and imaging results, formulated the assessment and plan and placed orders. The Patient requires high complexity decision making with multiple systems involvement.  Rounds were done with the Respiratory Therapy Director and Staff therapists and discussed with nursing staff also.  Yevonne Pax, MD [facility] Pulmonary Critical Care Medicine Sleep Medicine

## 2019-12-07 NOTE — Progress Notes (Signed)
IR.  History of dysphagia s/p gastrostomy tube placement at outside facility which was dislodged s/p gastrostomy tube replacement in IR 12/06/2019.  Patient awake laying in bed, nonverbal at baseline, eyes do not follow around room.  Gastrostomy tube site soft with minimal amounts of dried blood under bumper, no active bleeding, erythema, or drainage noted; bumper cinched to skin.  Gastrostomy tube stable- tube is ready for use. Please ensure bumper remains cinched to skin to prevent leakage. Further plans per [facility]. Please call IR with questions/concerns.   Waylan Boga Alcie Runions, PA-C 12/07/2019, 11:03 AM

## 2019-12-08 DIAGNOSIS — G9341 Metabolic encephalopathy: Secondary | ICD-10-CM | POA: Diagnosis not present

## 2019-12-08 DIAGNOSIS — J449 Chronic obstructive pulmonary disease, unspecified: Secondary | ICD-10-CM | POA: Diagnosis not present

## 2019-12-08 DIAGNOSIS — J9621 Acute and chronic respiratory failure with hypoxia: Secondary | ICD-10-CM | POA: Diagnosis not present

## 2019-12-08 DIAGNOSIS — I482 Chronic atrial fibrillation, unspecified: Secondary | ICD-10-CM | POA: Diagnosis not present

## 2019-12-08 NOTE — Progress Notes (Signed)
Pulmonary Critical Care Medicine [facility] [place]   PULMONARY CRITICAL CARE SERVICE  PROGRESS NOTE  Date of Service: 12/08/2019  Connor Roach  IHK:[id number]  DOB: 02-01-1961   DOA: 10/30/2019  Referring Physician: Carron Curie, MD  HPI: Connor Roach is a 59 y.o. male seen for follow up of Acute on Chronic Respiratory Failure.  Patient currently is on assist control mode has been on 40% FiO2 today the weaning goal is for 8 hours of pressure support  Medications: Reviewed on Rounds  Physical Exam:  Vitals: Temperature is 99.5 pulse 77 respiratory rate 15 blood pressure is 124/60 saturations 98%  Ventilator Settings on assist control FiO2 is 40% tidal volume 400 PEEP 7  . General: Comfortable at this time . Eyes: Grossly normal lids, irises & conjunctiva . ENT: grossly tongue is normal . Neck: no obvious mass . Cardiovascular: S1 S2 normal no gallop . Respiratory: No rhonchi very coarse breath sounds . Abdomen: soft . Skin: no rash seen on limited exam . Musculoskeletal: not rigid . Psychiatric:unable to assess . Neurologic: no seizure no involuntary movements         Lab Data:   Basic Metabolic Panel: Recent Labs  Lab 12/02/19 1745 12/04/19 1011 12/05/19 0511 12/06/19 0351 12/07/19 0441  NA 136 140 141 141 141  K 5.8* 4.8 4.5 5.1 4.8  CL 94* 96* 97* 98 98  CO2 32 36* 32 35* 35*  GLUCOSE 98 106* 101* 132* 98  BUN 27* 30* 32* 27* 30*  CREATININE 0.90 0.91 0.96 0.97 0.85  CALCIUM 7.3* 7.4* 7.3* 7.5* 7.7*  MG 2.0  --  2.0  --  2.0  PHOS 6.2* 5.1* 5.3* 6.1*  --     ABG: Recent Labs  Lab 12/01/19 1038  PHART 7.489*  PCO2ART 44.1  PO2ART 161*  HCO3 33.0*  O2SAT 99.3    Liver Function Tests: Recent Labs  Lab 12/02/19 1745 12/04/19 1011 12/05/19 0511 12/06/19 0351 12/07/19 0441  AST  --   --   --   --  22  ALT  --   --   --   --  14  ALKPHOS  --   --   --   --  73  BILITOT  --   --   --   --  0.3  PROT  --   --   --   --  4.5*   ALBUMIN 1.4* 1.3* 1.3* 1.2* 1.3*   No results for input(s): LIPASE, AMYLASE in the last 168 hours. Recent Labs  Lab 12/05/19 0511  AMMONIA 69*    CBC: Recent Labs  Lab 12/01/19 1159 12/01/19 1159 12/02/19 2055 12/05/19 0511 12/06/19 0351 12/07/19 0441 12/07/19 1700  WBC 9.8  --  10.3 6.5 7.5 6.9  --   NEUTROABS  --   --   --   --  6.8  --   --   HGB 7.7*  --  8.1* 6.4* 7.0* 8.2*  --   HCT 25.7*  --  25.6* 21.0* 22.9* 26.8*  --   MCV 93.5  --  90.5 92.5 93.5 94.7  --   PLT 126*   < > 141* 81* 54* 45* 43*   < > = values in this interval not displayed.    Cardiac Enzymes: No results for input(s): CKTOTAL, CKMB, CKMBINDEX, TROPONINI in the last 168 hours.  BNP (last 3 results) No results for input(s): BNP in the last 8760 hours.  ProBNP (last 3  results) No results for input(s): PROBNP in the last 8760 hours.  Radiological Exams: IR Replc Gastro/Colonic Tube Percut W/Fluoro  Result Date: 12/07/2019 INDICATION: 60 year old male with a history of displaced percutaneous gastrostomy EXAM: GASTROSTOMY CATHETER REPLACEMENT MEDICATIONS: 2 g Ancef; Antibiotics were administered within 1 hour of the procedure. None ANESTHESIA/SEDATION: No moderate sedation The patient was continuously monitored during the procedure by the interventional radiology nurse under my direct supervision. CONTRAST:  33mL OMNIPAQUE IOHEXOL 300 MG/ML SOLN - administered into the gastric lumen. FLUOROSCOPY TIME:  Fluoroscopy Time: 2 minutes 0 seconds COMPLICATIONS: None PROCEDURE: Informed written consent was obtained from the the patient's family after a thorough discussion of the procedural risks, benefits and alternatives. All questions were addressed. Maximal Sterile Barrier Technique was utilized including caps, mask, sterile gowns, sterile gloves, sterile drape, hand hygiene and skin antiseptic. A timeout was performed prior to the initiation of the procedure. The epigastrium was prepped with Betadine in a  sterile fashion, and a sterile drape was applied covering the operative field. A sterile gown and sterile gloves were used for the procedure. We removed the ostomy apparatus before the prep and drape procedure. Contrast was injected through the ostomy, identifying the tract to the stomach lumen. A 5 [place] Kum[place] the catheter was navigated into the stomach. Amplatz wire was placed. Twelve French dilation and 18 French dilation were performed. We then elected to place an 61 French balloon retention gastrostomy. This was advanced on the Amplatz wire into the stomach lumen and 8 cc of saline used to inflate the balloon. Wire was removed. Contrast injected confirmed location. Patient tolerated the procedure well and remained hemodynamically stable throughout. No complications were encountered and no significant blood loss encountered. IMPRESSION: Status post rescue and placement of a new 26 French balloon retention gastrostomy. Signed, Yvone Neu. Loreta Ave, DO [facility] Radiology Electronically Signed   By: Gilmer Mor D.O.   On: 12/07/2019 09:29    Assessment/Plan Active Problems:   Acute on chronic respiratory failure with hypoxia (HCC)   Chronic atrial fibrillation (HCC)   Seizure disorder (HCC)   Metabolic encephalopathy   COPD, severe (HCC)   1. Acute on chronic respiratory failure hypoxia we will continue with assist control mode for resting mode and wean on pressure support.  Today's goal for pressure support is 8 hours. 2. Chronic atrial fibrillation rate now rate is controlled we will continue to monitor on telemetry. 3. Seizure disorder no active seizures noted. 4. Metabolic encephalopathy no change continue supportive care. 5. Severe COPD medical management   I have personally seen and evaluated the patient, evaluated laboratory and imaging results, formulated the assessment and plan and placed orders. The Patient requires high complexity  decision making with multiple systems involvement.  Rounds were done with the Respiratory Therapy Director and Staff therapists and discussed with nursing staff also.  Yevonne Pax, MD [facility] Pulmonary Critical Care Medicine Sleep Medicine

## 2019-12-09 DIAGNOSIS — J449 Chronic obstructive pulmonary disease, unspecified: Secondary | ICD-10-CM | POA: Diagnosis not present

## 2019-12-09 DIAGNOSIS — G9341 Metabolic encephalopathy: Secondary | ICD-10-CM | POA: Diagnosis not present

## 2019-12-09 DIAGNOSIS — J9621 Acute and chronic respiratory failure with hypoxia: Secondary | ICD-10-CM | POA: Diagnosis not present

## 2019-12-09 DIAGNOSIS — I482 Chronic atrial fibrillation, unspecified: Secondary | ICD-10-CM | POA: Diagnosis not present

## 2019-12-09 LAB — CBC
HCT: 28.4 % — ABNORMAL LOW (ref 39.0–52.0)
Hemoglobin: 8.5 g/dL — ABNORMAL LOW (ref 13.0–17.0)
MCH: 29.5 pg (ref 26.0–34.0)
MCHC: 29.9 g/dL — ABNORMAL LOW (ref 30.0–36.0)
MCV: 98.6 fL (ref 80.0–100.0)
Platelets: 26 10*3/uL — CL (ref 150–400)
RBC: 2.88 MIL/uL — ABNORMAL LOW (ref 4.22–5.81)
RDW: 14.5 % (ref 11.5–15.5)
WBC: 5.8 10*3/uL (ref 4.0–10.5)
nRBC: 0.3 % — ABNORMAL HIGH (ref 0.0–0.2)

## 2019-12-09 LAB — AMMONIA: Ammonia: 192 umol/L — ABNORMAL HIGH (ref 9–35)

## 2019-12-09 LAB — RENAL FUNCTION PANEL
Albumin: 1.4 g/dL — ABNORMAL LOW (ref 3.5–5.0)
Anion gap: 6 (ref 5–15)
BUN: 24 mg/dL — ABNORMAL HIGH (ref 6–20)
CO2: 40 mmol/L — ABNORMAL HIGH (ref 22–32)
Calcium: 7.5 mg/dL — ABNORMAL LOW (ref 8.9–10.3)
Chloride: 99 mmol/L (ref 98–111)
Creatinine, Ser: 0.66 mg/dL (ref 0.61–1.24)
GFR, Estimated: >60 mL/min (ref >60)
Glucose, Bld: 139 mg/dL — ABNORMAL HIGH (ref 70–99)
Phosphorus: 3.5 mg/dL (ref 2.5–4.6)
Potassium: 4.5 mmol/L (ref 3.5–5.1)
Sodium: 145 mmol/L (ref 135–145)

## 2019-12-09 LAB — TRIGLYCERIDES: Triglycerides: 107 mg/dL (ref <150)

## 2019-12-09 LAB — PROTIME-INR
INR: 1.1 (ref 0.8–1.2)
Prothrombin Time: 14.1 seconds (ref 11.4–15.2)

## 2019-12-09 LAB — GENTAMICIN LEVEL, RANDOM: Gentamicin Rm: <0.5 ug/mL

## 2019-12-09 NOTE — Progress Notes (Signed)
Pulmonary Critical Care Medicine [facility] [place]   PULMONARY CRITICAL CARE SERVICE  PROGRESS NOTE  Date of Service: 12/09/2019  Connor Roach  GLO:[id number]  DOB: [birth year]   DOA: 10/30/2019  Referring Physician: Carron Curie, MD  HPI: Connor Roach is a 59 y.o. male seen for follow up of Acute on Chronic Respiratory Failure.  Patient currently is on pressure support has been on 12/5 for a goal of 12 hours  Medications: Reviewed on Rounds  Physical Exam:  Vitals: Temperature is 97.5 pulse 79 respiratory rate is 16 blood pressure is 135/71 saturations 98%  Ventilator Settings on pressure support on FiO2 of 40%  . General: Comfortable at this time . Eyes: Grossly normal lids, irises & conjunctiva . ENT: grossly tongue is normal . Neck: no obvious mass . Cardiovascular: S1 S2 normal no gallop . Respiratory: No rhonchi coarse breath sounds . Abdomen: soft . Skin: no rash seen on limited exam . Musculoskeletal: not rigid . Psychiatric:unable to assess . Neurologic: no seizure no involuntary movements         Lab Data:   Basic Metabolic Panel: Recent Labs  Lab 12/02/19 1745 12/02/19 1745 12/04/19 1011 12/05/19 0511 12/06/19 0351 12/07/19 0441 12/09/19 0525  NA 136   < > 140 141 141 141 145  K 5.8*   < > 4.8 4.5 5.1 4.8 4.5  CL 94*   < > 96* 97* 98 98 99  CO2 32   < > 36* 32 35* 35* 40*  GLUCOSE 98   < > 106* 101* 132* 98 139*  BUN 27*   < > 30* 32* 27* 30* 24*  CREATININE 0.90   < > 0.91 0.96 0.97 0.85 0.66  CALCIUM 7.3*   < > 7.4* 7.3* 7.5* 7.7* 7.5*  MG 2.0  --   --  2.0  --  2.0  --   PHOS 6.2*  --  5.1* 5.3* 6.1*  --  3.5   < > = values in this interval not displayed.    ABG: No results for input(s): PHART, PCO2ART, PO2ART, HCO3, O2SAT in the last 168 hours.  Liver Function Tests: Recent Labs  Lab 12/04/19 1011 12/05/19 0511 12/06/19 0351 12/07/19 0441 12/09/19 0525  AST  --   --   --  22  --   ALT  --   --   --  14  --    ALKPHOS  --   --   --  73  --   BILITOT  --   --   --  0.3  --   PROT  --   --   --  4.5*  --   ALBUMIN 1.3* 1.3* 1.2* 1.3* 1.4*   No results for input(s): LIPASE, AMYLASE in the last 168 hours. Recent Labs  Lab 12/05/19 0511  AMMONIA 69*    CBC: Recent Labs  Lab 12/02/19 2055 12/02/19 2055 12/05/19 0511 12/06/19 0351 12/07/19 0441 12/07/19 1700 12/09/19 0525  WBC 10.3  --  6.5 7.5 6.9  --  5.8  NEUTROABS  --   --   --  6.8  --   --   --   HGB 8.1*  --  6.4* 7.0* 8.2*  --  8.5*  HCT 25.6*  --  21.0* 22.9* 26.8*  --  28.4*  MCV 90.5  --  92.5 93.5 94.7  --  98.6  PLT 141*   < > 81* 54* 45* 43* 26*   < > =  values in this interval not displayed.    Cardiac Enzymes: No results for input(s): CKTOTAL, CKMB, CKMBINDEX, TROPONINI in the last 168 hours.  BNP (last 3 results) No results for input(s): BNP in the last 8760 hours.  ProBNP (last 3 results) No results for input(s): PROBNP in the last 8760 hours.  Radiological Exams: No results found.  Assessment/Plan Active Problems:   Acute on chronic respiratory failure with hypoxia (HCC)   Chronic atrial fibrillation (HCC)   Seizure disorder (HCC)   Metabolic encephalopathy   COPD, severe (HCC)   1. Acute on chronic respiratory failure hypoxia plan is to continue with weaning on pressure support continue secretion management supportive care with a goal of 12 hours 2. Chronic atrial fibrillation rate is controlled we will continue to follow along. 3. Metabolic encephalopathy no change continue present management 4. Severe COPD at baseline 5. Seizure disorder no active seizures noted   I have personally seen and evaluated the patient, evaluated laboratory and imaging results, formulated the assessment and plan and placed orders. The Patient requires high complexity decision making with multiple systems involvement.  Rounds were done with the Respiratory Therapy Director and Staff therapists and discussed with nursing  staff also.  Yevonne Pax, MD [facility] Pulmonary Critical Care Medicine Sleep Medicine

## 2019-12-10 DIAGNOSIS — J9621 Acute and chronic respiratory failure with hypoxia: Secondary | ICD-10-CM | POA: Diagnosis not present

## 2019-12-10 DIAGNOSIS — G9341 Metabolic encephalopathy: Secondary | ICD-10-CM | POA: Diagnosis not present

## 2019-12-10 DIAGNOSIS — J449 Chronic obstructive pulmonary disease, unspecified: Secondary | ICD-10-CM | POA: Diagnosis not present

## 2019-12-10 DIAGNOSIS — I482 Chronic atrial fibrillation, unspecified: Secondary | ICD-10-CM | POA: Diagnosis not present

## 2019-12-10 LAB — CBC
HCT: 28.3 % — ABNORMAL LOW (ref 39.0–52.0)
Hemoglobin: 8.1 g/dL — ABNORMAL LOW (ref 13.0–17.0)
MCH: 28.7 pg (ref 26.0–34.0)
MCHC: 28.6 g/dL — ABNORMAL LOW (ref 30.0–36.0)
MCV: 100.4 fL — ABNORMAL HIGH (ref 80.0–100.0)
Platelets: 26 10*3/uL — CL (ref 150–400)
RBC: 2.82 MIL/uL — ABNORMAL LOW (ref 4.22–5.81)
RDW: 14.5 % (ref 11.5–15.5)
WBC: 5.9 10*3/uL (ref 4.0–10.5)
nRBC: 0.5 % — ABNORMAL HIGH (ref 0.0–0.2)

## 2019-12-10 LAB — BASIC METABOLIC PANEL
Anion gap: 7 (ref 5–15)
BUN: 21 mg/dL — ABNORMAL HIGH (ref 6–20)
CO2: 40 mmol/L — ABNORMAL HIGH (ref 22–32)
Calcium: 7.8 mg/dL — ABNORMAL LOW (ref 8.9–10.3)
Chloride: 103 mmol/L (ref 98–111)
Creatinine, Ser: 0.65 mg/dL (ref 0.61–1.24)
GFR, Estimated: >60 mL/min (ref >60)
Glucose, Bld: 104 mg/dL — ABNORMAL HIGH (ref 70–99)
Potassium: 4.3 mmol/L (ref 3.5–5.1)
Sodium: 150 mmol/L — ABNORMAL HIGH (ref 135–145)

## 2019-12-10 LAB — LEVETIRACETAM LEVEL: Levetiracetam Lvl: 29.6 ug/mL (ref 10.0–40.0)

## 2019-12-10 LAB — MAGNESIUM: Magnesium: 2.2 mg/dL (ref 1.7–2.4)

## 2019-12-10 LAB — VANCOMYCIN, TROUGH: Vancomycin Tr: 13 ug/mL — ABNORMAL LOW (ref 15–20)

## 2019-12-10 LAB — AMMONIA: Ammonia: 63 umol/L — ABNORMAL HIGH (ref 9–35)

## 2019-12-10 NOTE — Progress Notes (Signed)
Pulmonary Critical Care Medicine [facility] [place]   PULMONARY CRITICAL CARE SERVICE  PROGRESS NOTE  Date of Service: 12/10/2019  Connor Roach  SVX:[id number]  DOB: 12-06-1960   DOA: 10/30/2019  Referring Physician: Carron Curie, MD  HPI: Connor Roach is a 59 y.o. male seen for follow up of Acute on Chronic Respiratory Failure.  Patient this morning was on pressure support 12/5 the goal was for 16 hours.  Medications: Reviewed on Rounds  Physical Exam:  Vitals: Temperature is 98.8 pulse 77 respiratory 22 blood pressure is 153/70 saturations 98%  Ventilator Settings on pressure support FiO2 40% pressure 12/5  . General: Comfortable at this time . Eyes: Grossly normal lids, irises & conjunctiva . ENT: grossly tongue is normal . Neck: no obvious mass . Cardiovascular: S1 S2 normal no gallop . Respiratory: No rhonchi very coarse breath sounds . Abdomen: soft . Skin: no rash seen on limited exam . Musculoskeletal: not rigid . Psychiatric:unable to assess . Neurologic: no seizure no involuntary movements         Lab Data:   Basic Metabolic Panel: Recent Labs  Lab 12/04/19 1011 12/04/19 1011 12/05/19 0511 12/06/19 0351 12/07/19 0441 12/09/19 0525 12/10/19 1152  NA 140   < > 141 141 141 145 150*  K 4.8   < > 4.5 5.1 4.8 4.5 4.3  CL 96*   < > 97* 98 98 99 103  CO2 36*   < > 32 35* 35* 40* 40*  GLUCOSE 106*   < > 101* 132* 98 139* 104*  BUN 30*   < > 32* 27* 30* 24* 21*  CREATININE 0.91   < > 0.96 0.97 0.85 0.66 0.65  CALCIUM 7.4*   < > 7.3* 7.5* 7.7* 7.5* 7.8*  MG  --   --  2.0  --  2.0  --  2.2  PHOS 5.1*  --  5.3* 6.1*  --  3.5  --    < > = values in this interval not displayed.    ABG: No results for input(s): PHART, PCO2ART, PO2ART, HCO3, O2SAT in the last 168 hours.  Liver Function Tests: Recent Labs  Lab 12/04/19 1011 12/05/19 0511 12/06/19 0351 12/07/19 0441 12/09/19 0525  AST  --   --   --  22  --   ALT  --   --   --  14  --    ALKPHOS  --   --   --  73  --   BILITOT  --   --   --  0.3  --   PROT  --   --   --  4.5*  --   ALBUMIN 1.3* 1.3* 1.2* 1.3* 1.4*   No results for input(s): LIPASE, AMYLASE in the last 168 hours. Recent Labs  Lab 12/05/19 0511 12/09/19 1728 12/10/19 1152  AMMONIA 69* 192* 63*    CBC: Recent Labs  Lab 12/05/19 0511 12/05/19 0511 12/06/19 0351 12/07/19 0441 12/07/19 1700 12/09/19 0525 12/10/19 1152  WBC 6.5  --  7.5 6.9  --  5.8 5.9  NEUTROABS  --   --  6.8  --   --   --   --   HGB 6.4*  --  7.0* 8.2*  --  8.5* 8.1*  HCT 21.0*  --  22.9* 26.8*  --  28.4* 28.3*  MCV 92.5  --  93.5 94.7  --  98.6 100.4*  PLT 81*   < > 54* 45* 43*  26* 26*   < > = values in this interval not displayed.    Cardiac Enzymes: No results for input(s): CKTOTAL, CKMB, CKMBINDEX, TROPONINI in the last 168 hours.  BNP (last 3 results) No results for input(s): BNP in the last 8760 hours.  ProBNP (last 3 results) No results for input(s): PROBNP in the last 8760 hours.  Radiological Exams: No results found.  Assessment/Plan Active Problems:   Acute on chronic respiratory failure with hypoxia (HCC)   Chronic atrial fibrillation (HCC)   Seizure disorder (HCC)   Metabolic encephalopathy   COPD, severe (HCC)   1. Acute on chronic respiratory failure with hypoxia we will continue with the weaning process today's goal is 16 hours on pressure support 12/5. 2. Chronic atrial fibrillation rate is controlled at this time we will continue supportive care 3. Seizure disorder no active seizures noted. 4. Metabolic encephalopathy no change 5. Severe COPD medical management nebulizers as necessary.   I have personally seen and evaluated the patient, evaluated laboratory and imaging results, formulated the assessment and plan and placed orders. The Patient requires high complexity decision making with multiple systems involvement.  Rounds were done with the Respiratory Therapy Director and Staff  therapists and discussed with nursing staff also.  Yevonne Pax, MD [facility] Pulmonary Critical Care Medicine Sleep Medicine

## 2019-12-11 ENCOUNTER — Other Ambulatory Visit (HOSPITAL_COMMUNITY): Payer: BC Managed Care – PPO

## 2019-12-11 DIAGNOSIS — J9621 Acute and chronic respiratory failure with hypoxia: Secondary | ICD-10-CM | POA: Diagnosis not present

## 2019-12-11 DIAGNOSIS — G9341 Metabolic encephalopathy: Secondary | ICD-10-CM | POA: Diagnosis not present

## 2019-12-11 DIAGNOSIS — I482 Chronic atrial fibrillation, unspecified: Secondary | ICD-10-CM | POA: Diagnosis not present

## 2019-12-11 DIAGNOSIS — J449 Chronic obstructive pulmonary disease, unspecified: Secondary | ICD-10-CM | POA: Diagnosis not present

## 2019-12-11 LAB — RENAL FUNCTION PANEL
Albumin: 1.5 g/dL — ABNORMAL LOW (ref 3.5–5.0)
Anion gap: 10 (ref 5–15)
BUN: 21 mg/dL — ABNORMAL HIGH (ref 6–20)
CO2: 37 mmol/L — ABNORMAL HIGH (ref 22–32)
Calcium: 7.9 mg/dL — ABNORMAL LOW (ref 8.9–10.3)
Chloride: 103 mmol/L (ref 98–111)
Creatinine, Ser: 0.6 mg/dL — ABNORMAL LOW (ref 0.61–1.24)
GFR, Estimated: >60 mL/min (ref >60)
Glucose, Bld: 165 mg/dL — ABNORMAL HIGH (ref 70–99)
Phosphorus: 3.8 mg/dL (ref 2.5–4.6)
Potassium: 4.4 mmol/L (ref 3.5–5.1)
Sodium: 150 mmol/L — ABNORMAL HIGH (ref 135–145)

## 2019-12-11 LAB — MAGNESIUM: Magnesium: 2 mg/dL (ref 1.7–2.4)

## 2019-12-11 NOTE — Progress Notes (Signed)
Pulmonary Critical Care Medicine [facility] [place]   PULMONARY CRITICAL CARE SERVICE  PROGRESS NOTE  Date of Service: 12/11/2019  Connor Roach  XTA:[id number]  DOB: Jun 25, 1960   DOA: 10/30/2019  Referring Physician: Carron Curie, MD  HPI: Connor Roach is a 59 y.o. male seen for follow up of Acute on Chronic Respiratory Failure.  Patient currently is on full support on the ventilator apparently was having issues with apnea and so therefore was placed back on assist control mode  Medications: Reviewed on Rounds  Physical Exam:  Vitals: Temperature is 96.9 pulse 70 respiratory rate is 12 blood pressure is 125/59 saturations 95%  Ventilator Settings assist control FiO2 35% tidal volume 400 PEEP 5   General: Comfortable at this time  Eyes: Grossly normal lids, irises & conjunctiva  ENT: grossly tongue is normal  Neck: no obvious mass  Cardiovascular: S1 S2 normal no gallop  Respiratory: Scattered rhonchi coarse breath sounds  Abdomen: soft  Skin: no rash seen on limited exam  Musculoskeletal: not rigid  Psychiatric:unable to assess  Neurologic: no seizure no involuntary movements         Lab Data:   Basic Metabolic Panel: Recent Labs  Lab 12/05/19 0511 12/05/19 0511 12/06/19 0351 12/07/19 0441 12/09/19 0525 12/10/19 1152 12/11/19 0525  NA 141   < > 141 141 145 150* 150*  K 4.5   < > 5.1 4.8 4.5 4.3 4.4  CL 97*   < > 98 98 99 103 103  CO2 32   < > 35* 35* 40* 40* 37*  GLUCOSE 101*   < > 132* 98 139* 104* 165*  BUN 32*   < > 27* 30* 24* 21* 21*  CREATININE 0.96   < > 0.97 0.85 0.66 0.65 0.60*  CALCIUM 7.3*   < > 7.5* 7.7* 7.5* 7.8* 7.9*  MG 2.0  --   --  2.0  --  2.2 2.0  PHOS 5.3*  --  6.1*  --  3.5  --  3.8   < > = values in this interval not displayed.    ABG: No results for input(s): PHART, PCO2ART, PO2ART, HCO3, O2SAT in the last 168 hours.  Liver Function Tests: Recent Labs  Lab 12/05/19 0511 12/06/19 0351 12/07/19 0441  12/09/19 0525 12/11/19 0525  AST  --   --  22  --   --   ALT  --   --  14  --   --   ALKPHOS  --   --  73  --   --   BILITOT  --   --  0.3  --   --   PROT  --   --  4.5*  --   --   ALBUMIN 1.3* 1.2* 1.3* 1.4* 1.5*   No results for input(s): LIPASE, AMYLASE in the last 168 hours. Recent Labs  Lab 12/05/19 0511 12/09/19 1728 12/10/19 1152  AMMONIA 69* 192* 63*    CBC: Recent Labs  Lab 12/05/19 0511 12/05/19 0511 12/06/19 0351 12/07/19 0441 12/07/19 1700 12/09/19 0525 12/10/19 1152  WBC 6.5  --  7.5 6.9  --  5.8 5.9  NEUTROABS  --   --  6.8  --   --   --   --   HGB 6.4*  --  7.0* 8.2*  --  8.5* 8.1*  HCT 21.0*  --  22.9* 26.8*  --  28.4* 28.3*  MCV 92.5  --  93.5 94.7  --  98.6 100.4*  PLT 81*   < > 54* 45* 43* 26* 26*   < > = values in this interval not displayed.    Cardiac Enzymes: No results for input(s): CKTOTAL, CKMB, CKMBINDEX, TROPONINI in the last 168 hours.  BNP (last 3 results) No results for input(s): BNP in the last 8760 hours.  ProBNP (last 3 results) No results for input(s): PROBNP in the last 8760 hours.  Radiological Exams: DG Abd 1 View  Result Date: 12/11/2019 CLINICAL DATA:  Ileus. EXAM: ABDOMEN - 1 VIEW COMPARISON:  CT scan 12/04/2019. FINDINGS: 0706 hours. No gaseous small bowel dilatation. Air is seen in the nondistended transverse colon with gas bubble in the medial right mid abdomen potentially related to cecal gas. Gastrostomy tube evident. Radiodensity projecting over the central pelvis likely related to rectal drain is seen on previous CT. IMPRESSION: Nonspecific bowel gas pattern. No gaseous bowel dilatation to suggest obstruction or overt ileus. Electronically Signed   By: Kennith Center M.D.   On: 12/11/2019 07:40   DG CHEST PORT 1 VIEW  Result Date: 12/11/2019 CLINICAL DATA:  Ventilator dependence. EXAM: PORTABLE CHEST 1 VIEW COMPARISON:  12/06/2019 FINDINGS: Tracheostomy tube again noted. Right PICC line tip overlies the distal SVC  level. Interstitial and diffuse airspace disease again noted with slight increase in retrocardiac collapse/consolidative opacity. Small bilateral pleural effusions again noted. Cardiopericardial silhouette is at upper limits of normal for size. Telemetry leads overlie the chest. IMPRESSION: Interval increase in retrocardiac collapse/consolidative opacity. Otherwise no substantial interval change. Electronically Signed   By: Kennith Center M.D.   On: 12/11/2019 07:37    Assessment/Plan Active Problems:   Acute on chronic respiratory failure with hypoxia (HCC)   Chronic atrial fibrillation (HCC)   Seizure disorder (HCC)   Metabolic encephalopathy   COPD, severe (HCC)   1. Acute on chronic respiratory failure with hypoxia we will continue with assist control mode right now is on 35% FiO2 with PEEP of 5 2. Chronic atrial fibrillation rate is controlled 3. Seizure disorder no active seizure noted. 4. Metabolic encephalopathy no changes 5. Severe COPD at baseline we will continue to follow   I have personally seen and evaluated the patient, evaluated laboratory and imaging results, formulated the assessment and plan and placed orders. The Patient requires high complexity decision making with multiple systems involvement.  Rounds were done with the Respiratory Therapy Director and Staff therapists and discussed with nursing staff also.  Yevonne Pax, MD [facility] Pulmonary Critical Care Medicine Sleep Medicine

## 2019-12-12 DIAGNOSIS — J9621 Acute and chronic respiratory failure with hypoxia: Secondary | ICD-10-CM | POA: Diagnosis not present

## 2019-12-12 DIAGNOSIS — J449 Chronic obstructive pulmonary disease, unspecified: Secondary | ICD-10-CM | POA: Diagnosis not present

## 2019-12-12 DIAGNOSIS — I482 Chronic atrial fibrillation, unspecified: Secondary | ICD-10-CM | POA: Diagnosis not present

## 2019-12-12 DIAGNOSIS — G9341 Metabolic encephalopathy: Secondary | ICD-10-CM | POA: Diagnosis not present

## 2019-12-12 LAB — CBC
HCT: 27.1 % — ABNORMAL LOW (ref 39.0–52.0)
Hemoglobin: 7.8 g/dL — ABNORMAL LOW (ref 13.0–17.0)
MCH: 29 pg (ref 26.0–34.0)
MCHC: 28.8 g/dL — ABNORMAL LOW (ref 30.0–36.0)
MCV: 100.7 fL — ABNORMAL HIGH (ref 80.0–100.0)
Platelets: 69 10*3/uL — ABNORMAL LOW (ref 150–400)
RBC: 2.69 MIL/uL — ABNORMAL LOW (ref 4.22–5.81)
RDW: 14.9 % (ref 11.5–15.5)
WBC: 7.1 10*3/uL (ref 4.0–10.5)
nRBC: 0.6 % — ABNORMAL HIGH (ref 0.0–0.2)

## 2019-12-12 LAB — RENAL FUNCTION PANEL
Albumin: 1.5 g/dL — ABNORMAL LOW (ref 3.5–5.0)
Anion gap: 9 (ref 5–15)
BUN: 21 mg/dL — ABNORMAL HIGH (ref 6–20)
CO2: 36 mmol/L — ABNORMAL HIGH (ref 22–32)
Calcium: 8 mg/dL — ABNORMAL LOW (ref 8.9–10.3)
Chloride: 102 mmol/L (ref 98–111)
Creatinine, Ser: 0.65 mg/dL (ref 0.61–1.24)
GFR, Estimated: >60 mL/min (ref >60)
Glucose, Bld: 173 mg/dL — ABNORMAL HIGH (ref 70–99)
Phosphorus: 4 mg/dL (ref 2.5–4.6)
Potassium: 4.5 mmol/L (ref 3.5–5.1)
Sodium: 147 mmol/L — ABNORMAL HIGH (ref 135–145)

## 2019-12-12 LAB — CULTURE, RESPIRATORY W GRAM STAIN

## 2019-12-12 LAB — MAGNESIUM: Magnesium: 2 mg/dL (ref 1.7–2.4)

## 2019-12-12 NOTE — Progress Notes (Signed)
Pulmonary Critical Care Medicine [facility] [place]   PULMONARY CRITICAL CARE SERVICE  PROGRESS NOTE  Date of Service: 12/12/2019  Connor Roach  LFY:[id number]  DOB: 05/08/1960   DOA: 10/30/2019  Referring Physician: Carron Curie, MD  HPI: Connor Roach is a 59 y.o. male seen for follow up of Acute on Chronic Respiratory Failure.  Patient is on the ventilator and full support right now with ARDS time.  Medications: Reviewed on Rounds  Physical Exam:  Vitals: Temperature is 97.3 pulse 69 respiratory 18 blood pressure is 142/74 saturations 97%  Ventilator Settings on assist control FiO2 28% PEEP 5 tidal volume 400  . General: Comfortable at this time . Eyes: Grossly normal lids, irises & conjunctiva . ENT: grossly tongue is normal . Neck: no obvious mass . Cardiovascular: S1 S2 normal no gallop . Respiratory: Scattered rhonchi expansion is equal . Abdomen: soft . Skin: no rash seen on limited exam . Musculoskeletal: not rigid . Psychiatric:unable to assess . Neurologic: no seizure no involuntary movements         Lab Data:   Basic Metabolic Panel: Recent Labs  Lab 12/06/19 0351 12/06/19 0351 12/07/19 0441 12/09/19 0525 12/10/19 1152 12/11/19 0525 12/12/19 0514  NA 141   < > 141 145 150* 150* 147*  K 5.1   < > 4.8 4.5 4.3 4.4 4.5  CL 98   < > 98 99 103 103 102  CO2 35*   < > 35* 40* 40* 37* 36*  GLUCOSE 132*   < > 98 139* 104* 165* 173*  BUN 27*   < > 30* 24* 21* 21* 21*  CREATININE 0.97   < > 0.85 0.66 0.65 0.60* 0.65  CALCIUM 7.5*   < > 7.7* 7.5* 7.8* 7.9* 8.0*  MG  --   --  2.0  --  2.2 2.0 2.0  PHOS 6.1*  --   --  3.5  --  3.8 4.0   < > = values in this interval not displayed.    ABG: No results for input(s): PHART, PCO2ART, PO2ART, HCO3, O2SAT in the last 168 hours.  Liver Function Tests: Recent Labs  Lab 12/06/19 0351 12/07/19 0441 12/09/19 0525 12/11/19 0525 12/12/19 0514  AST  --  22  --   --   --   ALT  --  14  --   --    --   ALKPHOS  --  73  --   --   --   BILITOT  --  0.3  --   --   --   PROT  --  4.5*  --   --   --   ALBUMIN 1.2* 1.3* 1.4* 1.5* 1.5*   No results for input(s): LIPASE, AMYLASE in the last 168 hours. Recent Labs  Lab 12/09/19 1728 12/10/19 1152  AMMONIA 192* 63*    CBC: Recent Labs  Lab 12/06/19 0351 12/06/19 0351 12/07/19 0441 12/07/19 1700 12/09/19 0525 12/10/19 1152 12/12/19 0514  WBC 7.5  --  6.9  --  5.8 5.9 7.1  NEUTROABS 6.8  --   --   --   --   --   --   HGB 7.0*  --  8.2*  --  8.5* 8.1* 7.8*  HCT 22.9*  --  26.8*  --  28.4* 28.3* 27.1*  MCV 93.5  --  94.7  --  98.6 100.4* 100.7*  PLT 54*   < > 45* 43* 26* 26* 69*   < > =  values in this interval not displayed.    Cardiac Enzymes: No results for input(s): CKTOTAL, CKMB, CKMBINDEX, TROPONINI in the last 168 hours.  BNP (last 3 results) No results for input(s): BNP in the last 8760 hours.  ProBNP (last 3 results) No results for input(s): PROBNP in the last 8760 hours.  Radiological Exams: DG Abd 1 View  Result Date: 12/11/2019 CLINICAL DATA:  Ileus. EXAM: ABDOMEN - 1 VIEW COMPARISON:  CT scan 12/04/2019. FINDINGS: 0706 hours. No gaseous small bowel dilatation. Air is seen in the nondistended transverse colon with gas bubble in the medial right mid abdomen potentially related to cecal gas. Gastrostomy tube evident. Radiodensity projecting over the central pelvis likely related to rectal drain is seen on previous CT. IMPRESSION: Nonspecific bowel gas pattern. No gaseous bowel dilatation to suggest obstruction or overt ileus. Electronically Signed   By: Kennith Center M.D.   On: 12/11/2019 07:40   DG CHEST PORT 1 VIEW  Result Date: 12/11/2019 CLINICAL DATA:  Ventilator dependence. EXAM: PORTABLE CHEST 1 VIEW COMPARISON:  12/06/2019 FINDINGS: Tracheostomy tube again noted. Right PICC line tip overlies the distal SVC level. Interstitial and diffuse airspace disease again noted with slight increase in retrocardiac  collapse/consolidative opacity. Small bilateral pleural effusions again noted. Cardiopericardial silhouette is at upper limits of normal for size. Telemetry leads overlie the chest. IMPRESSION: Interval increase in retrocardiac collapse/consolidative opacity. Otherwise no substantial interval change. Electronically Signed   By: Kennith Center M.D.   On: 12/11/2019 07:37    Assessment/Plan Active Problems:   Acute on chronic respiratory failure with hypoxia (HCC)   Chronic atrial fibrillation (HCC)   Seizure disorder (HCC)   Metabolic encephalopathy   COPD, severe (HCC)   1. Acute on chronic respiratory failure hypoxia we will continue with full support continue to check the RSB I mechanics.  Patient has not been able to tolerate weaning. 2. Chronic atrial fibrillation rate is controlled we will continue to follow 3. Seizure disorders no active seizures noted at this time. 4. 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 Severe COPD medical management   I have personally seen and evaluated the patient, evaluated laboratory and imaging results, formulated the assessment and plan and placed orders. The Patient requires high complexity decision making with multiple systems involvement.  Rounds were done with the Respiratory Therapy Director and Staff therapists and discussed with nursing staff also.  Yevonne Pax, MD [facility] Pulmonary Critical Care Medicine Sleep Medicine

## 2019-12-12 NOTE — Progress Notes (Addendum)
  We are asked to evaluate Mr. Connor Roach because it is leaking AGAIN.   He is well known to [place] for replacement of Gtubes after they become dislodged.   Also we had to completely remove at one point to try and let his old Gtube site heal some, then place a brand new one.   At one point his NGT came out the Gtube stoma.  Evaluation at bedside showed definitely leaking again.   The bumper seemed cinched down pretty good even though there is some sort of skin protectant under the bumper because the site is extremly moist, excoriated and irritated, which could create an issue with leakage.   He does not have high residuals per nurse so I don't think obstruction or gastroparesis is an issue.   He has an 18 Fr in now.   Discussed with Dr. Elby Showers and he recommends upsizing to 24 fr. Will plan to do this tomorrow.  If leakage continues to be an issue, we may need to place a Jtube to get the feeds past the pylorus.   Majour Frei S Laberta Wilbon PA-C 12/12/2019 1:54 PM

## 2019-12-13 DIAGNOSIS — I482 Chronic atrial fibrillation, unspecified: Secondary | ICD-10-CM | POA: Diagnosis not present

## 2019-12-13 DIAGNOSIS — G9341 Metabolic encephalopathy: Secondary | ICD-10-CM | POA: Diagnosis not present

## 2019-12-13 DIAGNOSIS — J9621 Acute and chronic respiratory failure with hypoxia: Secondary | ICD-10-CM | POA: Diagnosis not present

## 2019-12-13 DIAGNOSIS — J449 Chronic obstructive pulmonary disease, unspecified: Secondary | ICD-10-CM | POA: Diagnosis not present

## 2019-12-13 LAB — RENAL FUNCTION PANEL
Albumin: 1.5 g/dL — ABNORMAL LOW (ref 3.5–5.0)
Anion gap: 9 (ref 5–15)
BUN: 15 mg/dL (ref 6–20)
CO2: 36 mmol/L — ABNORMAL HIGH (ref 22–32)
Calcium: 7.9 mg/dL — ABNORMAL LOW (ref 8.9–10.3)
Chloride: 100 mmol/L (ref 98–111)
Creatinine, Ser: 0.62 mg/dL (ref 0.61–1.24)
GFR, Estimated: >60 mL/min (ref >60)
Glucose, Bld: 90 mg/dL (ref 70–99)
Phosphorus: 3.8 mg/dL (ref 2.5–4.6)
Potassium: 4.2 mmol/L (ref 3.5–5.1)
Sodium: 145 mmol/L (ref 135–145)

## 2019-12-13 LAB — BASIC METABOLIC PANEL
Anion gap: 9 (ref 5–15)
BUN: 18 mg/dL (ref 6–20)
CO2: 37 mmol/L — ABNORMAL HIGH (ref 22–32)
Calcium: 7.9 mg/dL — ABNORMAL LOW (ref 8.9–10.3)
Chloride: 102 mmol/L (ref 98–111)
Creatinine, Ser: 0.6 mg/dL — ABNORMAL LOW (ref 0.61–1.24)
GFR, Estimated: >60 mL/min (ref >60)
Glucose, Bld: 124 mg/dL — ABNORMAL HIGH (ref 70–99)
Potassium: 4.5 mmol/L (ref 3.5–5.1)
Sodium: 148 mmol/L — ABNORMAL HIGH (ref 135–145)

## 2019-12-13 LAB — PHOSPHORUS: Phosphorus: 3.8 mg/dL (ref 2.5–4.6)

## 2019-12-13 LAB — AMMONIA: Ammonia: 50 umol/L — ABNORMAL HIGH (ref 9–35)

## 2019-12-13 LAB — MAGNESIUM: Magnesium: 2 mg/dL (ref 1.7–2.4)

## 2019-12-13 LAB — VALPROIC ACID LEVEL: Valproic Acid Lvl: 37 ug/mL — ABNORMAL LOW (ref 50.0–100.0)

## 2019-12-13 NOTE — Progress Notes (Signed)
Pulmonary Critical Care Medicine [facility] [place]   PULMONARY CRITICAL CARE SERVICE  PROGRESS NOTE  Date of Service: 12/13/2019  Connor RHO  Roach:[id number]  DOB: [birth year]   DOA: 10/30/2019  Referring Physician: Carron Curie, MD  HPI: Connor Roach is a 59 y.o. male seen for follow up of Acute on Chronic Respiratory Failure.  Patient is on the ventilator and full support right now assist control mode mechanics are poor has not been able to do much in the way of weaning  Medications: Reviewed on Rounds  Physical Exam:  Vitals: Temperature is 96.0 pulse 86 respiratory rate 16 blood pressure is 154/81 saturations 98%  Ventilator Settings on assist control FiO2 35% tidal volume 400 PEEP 5  . General: Comfortable at this time . Eyes: Grossly normal lids, irises & conjunctiva . ENT: grossly tongue is normal . Neck: no obvious mass . Cardiovascular: S1 S2 normal no gallop . Respiratory: No rhonchi very coarse breath sounds . Abdomen: soft . Skin: no rash seen on limited exam . Musculoskeletal: not rigid . Psychiatric:unable to assess . Neurologic: no seizure no involuntary movements         Lab Data:   Basic Metabolic Panel: Recent Labs  Lab 12/07/19 0441 12/07/19 0441 12/09/19 0525 12/10/19 1152 12/11/19 0525 12/12/19 0514 12/13/19 0420  NA 141   < > 145 150* 150* 147* 148*  K 4.8   < > 4.5 4.3 4.4 4.5 4.5  CL 98   < > 99 103 103 102 102  CO2 35*   < > 40* 40* 37* 36* 37*  GLUCOSE 98   < > 139* 104* 165* 173* 124*  BUN 30*   < > 24* 21* 21* 21* 18  CREATININE 0.85   < > 0.66 0.65 0.60* 0.65 0.60*  CALCIUM 7.7*   < > 7.5* 7.8* 7.9* 8.0* 7.9*  MG 2.0  --   --  2.2 2.0 2.0 2.0  PHOS  --   --  3.5  --  3.8 4.0 3.8   < > = values in this interval not displayed.    ABG: No results for input(s): PHART, PCO2ART, PO2ART, HCO3, O2SAT in the last 168 hours.  Liver Function Tests: Recent Labs  Lab 12/07/19 0441 12/09/19 0525 12/11/19 0525  12/12/19 0514  AST 22  --   --   --   ALT 14  --   --   --   ALKPHOS 73  --   --   --   BILITOT 0.3  --   --   --   PROT 4.5*  --   --   --   ALBUMIN 1.3* 1.4* 1.5* 1.5*   No results for input(s): LIPASE, AMYLASE in the last 168 hours. Recent Labs  Lab 12/09/19 1728 12/10/19 1152 12/13/19 0420  AMMONIA 192* 63* 50*    CBC: Recent Labs  Lab 12/07/19 0441 12/07/19 1700 12/09/19 0525 12/10/19 1152 12/12/19 0514  WBC 6.9  --  5.8 5.9 7.1  HGB 8.2*  --  8.5* 8.1* 7.8*  HCT 26.8*  --  28.4* 28.3* 27.1*  MCV 94.7  --  98.6 100.4* 100.7*  PLT 45* 43* 26* 26* 69*    Cardiac Enzymes: No results for input(s): CKTOTAL, CKMB, CKMBINDEX, TROPONINI in the last 168 hours.  BNP (last 3 results) No results for input(s): BNP in the last 8760 hours.  ProBNP (last 3 results) No results for input(s): PROBNP in the last 8760 hours.  Radiological Exams: No results found.  Assessment/Plan Active Problems:   Acute on chronic respiratory failure with hypoxia (HCC)   Chronic atrial fibrillation (HCC)   Seizure disorder (HCC)   Metabolic encephalopathy   COPD, severe (HCC)   1. Acute on chronic respiratory failure with hypoxia we will continue with assist control mode titrate oxygen as tolerated check the RSB I 2. Chronic atrial fibrillation rate is controlled at this time 3. Seizure disorder no changes are noted we will continue to monitor 4. Metabolic encephalopathy at baseline 5. Severe COPD at baseline   I have personally seen and evaluated the patient, evaluated laboratory and imaging results, formulated the assessment and plan and placed orders. The Patient requires high complexity decision making with multiple systems involvement.  Rounds were done with the Respiratory Therapy Director and Staff therapists and discussed with nursing staff also.  Yevonne Pax, MD [facility] Pulmonary Critical Care Medicine Sleep Medicine

## 2019-12-14 ENCOUNTER — Other Ambulatory Visit (HOSPITAL_COMMUNITY): Payer: BC Managed Care – PPO

## 2019-12-14 DIAGNOSIS — I482 Chronic atrial fibrillation, unspecified: Secondary | ICD-10-CM | POA: Diagnosis not present

## 2019-12-14 DIAGNOSIS — G9341 Metabolic encephalopathy: Secondary | ICD-10-CM | POA: Diagnosis not present

## 2019-12-14 DIAGNOSIS — J9621 Acute and chronic respiratory failure with hypoxia: Secondary | ICD-10-CM | POA: Diagnosis not present

## 2019-12-14 DIAGNOSIS — J449 Chronic obstructive pulmonary disease, unspecified: Secondary | ICD-10-CM | POA: Diagnosis not present

## 2019-12-14 HISTORY — PX: IR REPLC GASTRO/COLONIC TUBE PERCUT W/FLUORO: IMG2333

## 2019-12-14 LAB — RENAL FUNCTION PANEL
Albumin: 1.5 g/dL — ABNORMAL LOW (ref 3.5–5.0)
Anion gap: 7 (ref 5–15)
BUN: 11 mg/dL (ref 6–20)
CO2: 36 mmol/L — ABNORMAL HIGH (ref 22–32)
Calcium: 7.9 mg/dL — ABNORMAL LOW (ref 8.9–10.3)
Chloride: 100 mmol/L (ref 98–111)
Creatinine, Ser: 0.66 mg/dL (ref 0.61–1.24)
GFR, Estimated: >60 mL/min (ref >60)
Glucose, Bld: 113 mg/dL — ABNORMAL HIGH (ref 70–99)
Phosphorus: 3.5 mg/dL (ref 2.5–4.6)
Potassium: 4.8 mmol/L (ref 3.5–5.1)
Sodium: 143 mmol/L (ref 135–145)

## 2019-12-14 LAB — CBC
HCT: 28.2 % — ABNORMAL LOW (ref 39.0–52.0)
Hemoglobin: 8.3 g/dL — ABNORMAL LOW (ref 13.0–17.0)
MCH: 28.9 pg (ref 26.0–34.0)
MCHC: 29.4 g/dL — ABNORMAL LOW (ref 30.0–36.0)
MCV: 98.3 fL (ref 80.0–100.0)
Platelets: 125 10*3/uL — ABNORMAL LOW (ref 150–400)
RBC: 2.87 MIL/uL — ABNORMAL LOW (ref 4.22–5.81)
RDW: 15.3 % (ref 11.5–15.5)
WBC: 11.6 10*3/uL — ABNORMAL HIGH (ref 4.0–10.5)
nRBC: 0 % (ref 0.0–0.2)

## 2019-12-14 LAB — MAGNESIUM: Magnesium: 1.8 mg/dL (ref 1.7–2.4)

## 2019-12-14 MED ORDER — IOHEXOL 300 MG/ML  SOLN
50.0000 mL | Freq: Once | INTRAMUSCULAR | Status: AC | PRN
  Administered 2019-12-14: 8 mL

## 2019-12-14 MED ORDER — LIDOCAINE VISCOUS HCL 2 % MT SOLN
OROMUCOSAL | Status: AC
  Filled 2019-12-14: qty 15

## 2019-12-14 NOTE — Procedures (Signed)
Interventional Radiology Procedure Note  Procedure: Upsize of percutaneous 54F pull-through gastrostomy tube, now a 25F. Balloon was inflated with 8cc saline, and tightened to the abd wall.  Complications: None  Recommendations: - OK to use - Maintain tight to the abdominal wall, with dry dressing under the bumper.  Change as needed to maintain dry environment, and moisture barrier cream as needed.  Signed,   Yvone Neu. Loreta Ave, DO

## 2019-12-14 NOTE — Progress Notes (Signed)
Pulmonary Critical Care Medicine [facility] [place]   PULMONARY CRITICAL CARE SERVICE  PROGRESS NOTE  Date of Service: 12/14/2019  Connor Roach  GDJ:[id number]  DOB: 1960-03-15   DOA: 10/30/2019  Referring Physician: Carron Curie, MD  HPI: Connor Roach is a 59 y.o. male seen for follow up of Acute on Chronic Respiratory Failure.  Patient currently is on assist control mode on 40% FiO2 with a PEEP of 5  Medications: Reviewed on Rounds  Physical Exam:  Vitals: Temperature is 99.0 pulse 76 respiratory 18 blood pressure is 122/66 saturations 98%  Ventilator Settings on assist control FiO2 is 40% tidal volume is 400 with a PEEP of 5  . General: Comfortable at this time . Eyes: Grossly normal lids, irises & conjunctiva . ENT: grossly tongue is normal . Neck: no obvious mass . Cardiovascular: S1 S2 normal no gallop . Respiratory: No rhonchi very coarse breath sounds . Abdomen: soft . Skin: no rash seen on limited exam . Musculoskeletal: not rigid . Psychiatric:unable to assess . Neurologic: no seizure no involuntary movements         Lab Data:   Basic Metabolic Panel: Recent Labs  Lab 12/10/19 1152 12/10/19 1152 12/11/19 0525 12/12/19 0514 12/13/19 0420 12/13/19 1714 12/14/19 0511  NA 150*   < > 150* 147* 148* 145 143  K 4.3   < > 4.4 4.5 4.5 4.2 4.8  CL 103   < > 103 102 102 100 100  CO2 40*   < > 37* 36* 37* 36* 36*  GLUCOSE 104*   < > 165* 173* 124* 90 113*  BUN 21*   < > 21* 21* 18 15 11   CREATININE 0.65   < > 0.60* 0.65 0.60* 0.62 0.66  CALCIUM 7.8*   < > 7.9* 8.0* 7.9* 7.9* 7.9*  MG 2.2  --  2.0 2.0 2.0  --  1.8  PHOS  --   --  3.8 4.0 3.8 3.8 3.5   < > = values in this interval not displayed.    ABG: No results for input(s): PHART, PCO2ART, PO2ART, HCO3, O2SAT in the last 168 hours.  Liver Function Tests: Recent Labs  Lab 12/09/19 0525 12/11/19 0525 12/12/19 0514 12/13/19 1714 12/14/19 0511  ALBUMIN 1.4* 1.5* 1.5* 1.5* 1.5*    No results for input(s): LIPASE, AMYLASE in the last 168 hours. Recent Labs  Lab 12/09/19 1728 12/10/19 1152 12/13/19 0420  AMMONIA 192* 63* 50*    CBC: Recent Labs  Lab 12/07/19 1700 12/09/19 0525 12/10/19 1152 12/12/19 0514 12/14/19 0511  WBC  --  5.8 5.9 7.1 11.6*  HGB  --  8.5* 8.1* 7.8* 8.3*  HCT  --  28.4* 28.3* 27.1* 28.2*  MCV  --  98.6 100.4* 100.7* 98.3  PLT 43* 26* 26* 69* 125*    Cardiac Enzymes: No results for input(s): CKTOTAL, CKMB, CKMBINDEX, TROPONINI in the last 168 hours.  BNP (last 3 results) No results for input(s): BNP in the last 8760 hours.  ProBNP (last 3 results) No results for input(s): PROBNP in the last 8760 hours.  Radiological Exams: No results found.  Assessment/Plan Active Problems:   Acute on chronic respiratory failure with hypoxia (HCC)   Chronic atrial fibrillation (HCC)   Seizure disorder (HCC)   Metabolic encephalopathy   COPD, severe (HCC)   1. Acute on chronic respiratory failure with hypoxia we will continue with assist control not weaning at this time.  Patient has been having poor tolerance  2. Chronic atrial fibrillation rate is controlled we will monitor 3. Seizure disorder no active seizure noted 4. Metabolic encephalopathy patient appears to be at baseline 5. Severe COPD at baseline medical management   I have personally seen and evaluated the patient, evaluated laboratory and imaging results, formulated the assessment and plan and placed orders. The Patient requires high complexity decision making with multiple systems involvement.  Rounds were done with the Respiratory Therapy Director and Staff therapists and discussed with nursing staff also.  Yevonne Pax, MD [facility] Pulmonary Critical Care Medicine Sleep Medicine

## 2019-12-15 ENCOUNTER — Other Ambulatory Visit (HOSPITAL_COMMUNITY): Payer: BC Managed Care – PPO

## 2019-12-15 DIAGNOSIS — G9341 Metabolic encephalopathy: Secondary | ICD-10-CM | POA: Diagnosis not present

## 2019-12-15 DIAGNOSIS — J449 Chronic obstructive pulmonary disease, unspecified: Secondary | ICD-10-CM | POA: Diagnosis not present

## 2019-12-15 DIAGNOSIS — J9621 Acute and chronic respiratory failure with hypoxia: Secondary | ICD-10-CM | POA: Diagnosis not present

## 2019-12-15 DIAGNOSIS — I482 Chronic atrial fibrillation, unspecified: Secondary | ICD-10-CM | POA: Diagnosis not present

## 2019-12-15 LAB — BASIC METABOLIC PANEL
Anion gap: 8 (ref 5–15)
BUN: 9 mg/dL (ref 6–20)
CO2: 33 mmol/L — ABNORMAL HIGH (ref 22–32)
Calcium: 7.7 mg/dL — ABNORMAL LOW (ref 8.9–10.3)
Chloride: 98 mmol/L (ref 98–111)
Creatinine, Ser: 0.62 mg/dL (ref 0.61–1.24)
GFR, Estimated: >60 mL/min (ref >60)
Glucose, Bld: 89 mg/dL (ref 70–99)
Potassium: 4.5 mmol/L (ref 3.5–5.1)
Sodium: 139 mmol/L (ref 135–145)

## 2019-12-15 LAB — MAGNESIUM: Magnesium: 1.6 mg/dL — ABNORMAL LOW (ref 1.7–2.4)

## 2019-12-15 LAB — CBC
HCT: 25.9 % — ABNORMAL LOW (ref 39.0–52.0)
Hemoglobin: 7.8 g/dL — ABNORMAL LOW (ref 13.0–17.0)
MCH: 29.1 pg (ref 26.0–34.0)
MCHC: 30.1 g/dL (ref 30.0–36.0)
MCV: 96.6 fL (ref 80.0–100.0)
Platelets: 229 10*3/uL (ref 150–400)
RBC: 2.68 MIL/uL — ABNORMAL LOW (ref 4.22–5.81)
RDW: 15.4 % (ref 11.5–15.5)
WBC: 9.9 10*3/uL (ref 4.0–10.5)
nRBC: 0 % (ref 0.0–0.2)

## 2019-12-15 NOTE — Progress Notes (Signed)
Pulmonary Critical Care Medicine [facility] [place]   PULMONARY CRITICAL CARE SERVICE  PROGRESS NOTE  Date of Service: 12/15/2019  Connor Roach  VXB:[id number]  DOB: 04-18-1960   DOA: 10/30/2019  Referring Physician: Carron Curie, MD  HPI: Connor Roach is a 59 y.o. male seen for follow up of Acute on Chronic Respiratory Failure.  Patient currently is on pressure support mode has been on 40% FiO2 with pressure support of 12 5  Medications: Reviewed on Rounds  Physical Exam:  Vitals: Temperature is 98.4 pulse 71 respiratory rate 18 blood pressure is 172/87 saturations 100%  Ventilator Settings on pressure support FiO2 40% tidal volume is 690 pressure of 12/5  . General: Comfortable at this time . Eyes: Grossly normal lids, irises & conjunctiva . ENT: grossly tongue is normal . Neck: no obvious mass . Cardiovascular: S1 S2 normal no gallop . Respiratory: No rhonchi no rales are noted at this time . Abdomen: soft . Skin: no rash seen on limited exam . Musculoskeletal: not rigid . Psychiatric:unable to assess . Neurologic: no seizure no involuntary movements         Lab Data:   Basic Metabolic Panel: Recent Labs  Lab 12/10/19 1152 12/10/19 1152 12/11/19 0525 12/12/19 0514 12/13/19 0420 12/13/19 1714 12/14/19 0511  NA 150*   < > 150* 147* 148* 145 143  K 4.3   < > 4.4 4.5 4.5 4.2 4.8  CL 103   < > 103 102 102 100 100  CO2 40*   < > 37* 36* 37* 36* 36*  GLUCOSE 104*   < > 165* 173* 124* 90 113*  BUN 21*   < > 21* 21* 18 15 11   CREATININE 0.65   < > 0.60* 0.65 0.60* 0.62 0.66  CALCIUM 7.8*   < > 7.9* 8.0* 7.9* 7.9* 7.9*  MG 2.2  --  2.0 2.0 2.0  --  1.8  PHOS  --   --  3.8 4.0 3.8 3.8 3.5   < > = values in this interval not displayed.    ABG: No results for input(s): PHART, PCO2ART, PO2ART, HCO3, O2SAT in the last 168 hours.  Liver Function Tests: Recent Labs  Lab 12/09/19 0525 12/11/19 0525 12/12/19 0514 12/13/19 1714 12/14/19 0511   ALBUMIN 1.4* 1.5* 1.5* 1.5* 1.5*   No results for input(s): LIPASE, AMYLASE in the last 168 hours. Recent Labs  Lab 12/09/19 1728 12/10/19 1152 12/13/19 0420  AMMONIA 192* 63* 50*    CBC: Recent Labs  Lab 12/09/19 0525 12/10/19 1152 12/12/19 0514 12/14/19 0511  WBC 5.8 5.9 7.1 11.6*  HGB 8.5* 8.1* 7.8* 8.3*  HCT 28.4* 28.3* 27.1* 28.2*  MCV 98.6 100.4* 100.7* 98.3  PLT 26* 26* 69* 125*    Cardiac Enzymes: No results for input(s): CKTOTAL, CKMB, CKMBINDEX, TROPONINI in the last 168 hours.  BNP (last 3 results) No results for input(s): BNP in the last 8760 hours.  ProBNP (last 3 results) No results for input(s): PROBNP in the last 8760 hours.  Radiological Exams: IR Replc Gastro/Colonic Tube Percut W/Fluoro  Result Date: 12/14/2019 INDICATION: 59 year old male with a history of leakage at gastrostomy tube site EXAM: GASTROSTOMY CATHETER REPLACEMENT MEDICATIONS: None ANESTHESIA/SEDATION: None CONTRAST:  51mL OMNIPAQUE IOHEXOL 300 MG/ML SOLN - administered into the gastric lumen. FLUOROSCOPY TIME:  Fluoroscopy Time: 0 minutes 6 seconds (1 mGy). COMPLICATIONS: None PROCEDURE: Informed consent was presumed from given prior discussion with the family and the presence of the gastrostomy tube,  which we have placed previously. All questions were addressed. Maximal Sterile Barrier Technique was utilized including caps, mask, sterile gowns, sterile gloves, sterile drape, hand hygiene and skin antiseptic. A timeout was performed prior to the initiation of the procedure. Patient was positioned on the fluoroscopy table. Contrast was injected confirming position. The balloon was deflated and the old 58 French tube was exchanged for a new 22 [place] gastrostomy tube. Balloon was inflated with 8 cc of saline and the flange was tightened at the abdominal wall. Dry dressing was placed. Final image was stored confirmed position in the stomach lumen. Patient tolerated the procedure well and remained  hemodynamically stable throughout. No complications were encountered and no significant blood loss encountered. IMPRESSION: Status post fluoroscopic placed percutaneous gastrostomy tube, with new 22 French gastrostomy. Signed, Yvone Neu. Loreta Ave, DO [facility] Radiology Electronically Signed   By: Gilmer Mor D.O.   On: 12/14/2019 12:33   DG CHEST PORT 1 VIEW  Result Date: 12/15/2019 CLINICAL DATA:  Follow-up of acute on chronic respiratory failure. EXAM: PORTABLE CHEST 1 VIEW COMPARISON:  December 11, 2019 FINDINGS: No pneumothorax. Stable tracheostomy tube and right PICC line. Bilateral layering pleural effusions with underlying opacities persist, similar in the interval. No change in the cardiomediastinal silhouette. Increased interstitial markings in the lungs suggest the possibility of mild edema versus atypical infection. IMPRESSION: 1. No interval change in bilateral layering pleural effusions with underlying opacities. 2. Increased interstitial markings in the lungs suggest mild edema versus atypical infection. Electronically Signed   By: Gerome Sam III M.D   On: 12/15/2019 09:07    Assessment/Plan Active Problems:   Acute on chronic respiratory failure with hypoxia (HCC)   Chronic atrial fibrillation (HCC)   Seizure disorder (HCC)   Metabolic encephalopathy   COPD, severe (HCC)   1. Acute on chronic respiratory failure with hypoxia we will continue with pressure support ventilation.  Volumes are good.  Patient is status post PEG tube placement 2. Chronic atrial fibrillation rate is controlled 3. Seizure disorder no active seizures noted at this time we will continue to monitor 4. Metabolic encephalopathy at baseline continue to monitor closely.   I have personally seen and evaluated the patient, evaluated laboratory and imaging results, formulated the assessment and plan and placed orders. The Patient requires high complexity  decision making with multiple systems involvement.  Rounds were done with the Respiratory Therapy Director and Staff therapists and discussed with nursing staff also.  Yevonne Pax, MD [facility] Pulmonary Critical Care Medicine Sleep Medicine

## 2019-12-16 DIAGNOSIS — J9621 Acute and chronic respiratory failure with hypoxia: Secondary | ICD-10-CM | POA: Diagnosis not present

## 2019-12-16 DIAGNOSIS — I482 Chronic atrial fibrillation, unspecified: Secondary | ICD-10-CM | POA: Diagnosis not present

## 2019-12-16 DIAGNOSIS — J449 Chronic obstructive pulmonary disease, unspecified: Secondary | ICD-10-CM | POA: Diagnosis not present

## 2019-12-16 DIAGNOSIS — G9341 Metabolic encephalopathy: Secondary | ICD-10-CM | POA: Diagnosis not present

## 2019-12-16 LAB — MAGNESIUM: Magnesium: 1.9 mg/dL (ref 1.7–2.4)

## 2019-12-16 LAB — TRIGLYCERIDES: Triglycerides: 126 mg/dL (ref <150)

## 2019-12-16 NOTE — Progress Notes (Signed)
Pulmonary Critical Care Medicine [facility] [place]   PULMONARY CRITICAL CARE SERVICE  PROGRESS NOTE  Date of Service: 12/16/2019  Connor Roach  GEX:[id number]  DOB: [birth year]   DOA: 10/30/2019  Referring Physician: Carron Curie, MD  HPI: Connor Roach is a 59 y.o. male seen for follow up of Acute on Chronic Respiratory Failure.  Patient currently is on pressure support goal is for 8 hours  Medications: Reviewed on Rounds  Physical Exam:  Vitals: Temperature is 97.3 pulse 66 respiratory rate 17 blood pressure is 163/90 saturations 98%  Ventilator Settings on pressure support FiO2 35% pressure 12/5  . General: Comfortable at this time . Eyes: Grossly normal lids, irises & conjunctiva . ENT: grossly tongue is normal . Neck: no obvious mass . Cardiovascular: S1 S2 normal no gallop . Respiratory: No rhonchi very coarse breath sounds . Abdomen: soft . Skin: no rash seen on limited exam . Musculoskeletal: not rigid . Psychiatric:unable to assess . Neurologic: no seizure no involuntary movements         Lab Data:   Basic Metabolic Panel: Recent Labs  Lab 12/11/19 0525 12/11/19 0525 12/12/19 0514 12/13/19 0420 12/13/19 1714 12/14/19 0511 12/15/19 1334 12/16/19 0717  NA 150*   < > 147* 148* 145 143 139  --   K 4.4   < > 4.5 4.5 4.2 4.8 4.5  --   CL 103   < > 102 102 100 100 98  --   CO2 37*   < > 36* 37* 36* 36* 33*  --   GLUCOSE 165*   < > 173* 124* 90 113* 89  --   BUN 21*   < > 21* 18 15 11 9   --   CREATININE 0.60*   < > 0.65 0.60* 0.62 0.66 0.62  --   CALCIUM 7.9*   < > 8.0* 7.9* 7.9* 7.9* 7.7*  --   MG 2.0   < > 2.0 2.0  --  1.8 1.6* 1.9  PHOS 3.8  --  4.0 3.8 3.8 3.5  --   --    < > = values in this interval not displayed.    ABG: No results for input(s): PHART, PCO2ART, PO2ART, HCO3, O2SAT in the last 168 hours.  Liver Function Tests: Recent Labs  Lab 12/11/19 0525 12/12/19 0514 12/13/19 1714 12/14/19 0511  ALBUMIN 1.5* 1.5* 1.5*  1.5*   No results for input(s): LIPASE, AMYLASE in the last 168 hours. Recent Labs  Lab 12/09/19 1728 12/10/19 1152 12/13/19 0420  AMMONIA 192* 63* 50*    CBC: Recent Labs  Lab 12/10/19 1152 12/12/19 0514 12/14/19 0511 12/15/19 1334  WBC 5.9 7.1 11.6* 9.9  HGB 8.1* 7.8* 8.3* 7.8*  HCT 28.3* 27.1* 28.2* 25.9*  MCV 100.4* 100.7* 98.3 96.6  PLT 26* 69* 125* 229    Cardiac Enzymes: No results for input(s): CKTOTAL, CKMB, CKMBINDEX, TROPONINI in the last 168 hours.  BNP (last 3 results) No results for input(s): BNP in the last 8760 hours.  ProBNP (last 3 results) No results for input(s): PROBNP in the last 8760 hours.  Radiological Exams: DG CHEST PORT 1 VIEW  Result Date: 12/15/2019 CLINICAL DATA:  Follow-up of acute on chronic respiratory failure. EXAM: PORTABLE CHEST 1 VIEW COMPARISON:  December 11, 2019 FINDINGS: No pneumothorax. Stable tracheostomy tube and right PICC line. Bilateral layering pleural effusions with underlying opacities persist, similar in the interval. No change in the cardiomediastinal silhouette. Increased interstitial markings in the lungs  suggest the possibility of mild edema versus atypical infection. IMPRESSION: 1. No interval change in bilateral layering pleural effusions with underlying opacities. 2. Increased interstitial markings in the lungs suggest mild edema versus atypical infection. Electronically Signed   By: Gerome Sam III M.D   On: 12/15/2019 09:07    Assessment/Plan Active Problems:   Acute on chronic respiratory failure with hypoxia (HCC)   Chronic atrial fibrillation (HCC)   Seizure disorder (HCC)   Metabolic encephalopathy   COPD, severe (HCC)   1. Acute on chronic respiratory failure hypoxia patient's been weaning on pressure support 12/5 the goal today is for 8 hours 2. Chronic atrial fibrillation rate controlled continue to monitor 3. Seizure disorder no active seizure noted we will continue to follow 4. Metabolic  encephalopathy no change 5. Severe COPD patient is at baseline   I have personally seen and evaluated the patient, evaluated laboratory and imaging results, formulated the assessment and plan and placed orders. The Patient requires high complexity decision making with multiple systems involvement.  Rounds were done with the Respiratory Therapy Director and Staff therapists and discussed with nursing staff also.  Yevonne Pax, MD [facility] Pulmonary Critical Care Medicine Sleep Medicine

## 2019-12-17 DIAGNOSIS — J9621 Acute and chronic respiratory failure with hypoxia: Secondary | ICD-10-CM | POA: Diagnosis not present

## 2019-12-17 DIAGNOSIS — I482 Chronic atrial fibrillation, unspecified: Secondary | ICD-10-CM | POA: Diagnosis not present

## 2019-12-17 DIAGNOSIS — J449 Chronic obstructive pulmonary disease, unspecified: Secondary | ICD-10-CM | POA: Diagnosis not present

## 2019-12-17 DIAGNOSIS — G9341 Metabolic encephalopathy: Secondary | ICD-10-CM | POA: Diagnosis not present

## 2019-12-17 LAB — CBC
HCT: 26.3 % — ABNORMAL LOW (ref 39.0–52.0)
Hemoglobin: 8.1 g/dL — ABNORMAL LOW (ref 13.0–17.0)
MCH: 29.7 pg (ref 26.0–34.0)
MCHC: 30.8 g/dL (ref 30.0–36.0)
MCV: 96.3 fL (ref 80.0–100.0)
Platelets: 392 10*3/uL (ref 150–400)
RBC: 2.73 MIL/uL — ABNORMAL LOW (ref 4.22–5.81)
RDW: 15.9 % — ABNORMAL HIGH (ref 11.5–15.5)
WBC: 11.1 10*3/uL — ABNORMAL HIGH (ref 4.0–10.5)
nRBC: 0 % (ref 0.0–0.2)

## 2019-12-17 LAB — PHOSPHORUS: Phosphorus: 4.2 mg/dL (ref 2.5–4.6)

## 2019-12-17 LAB — BASIC METABOLIC PANEL
Anion gap: 8 (ref 5–15)
BUN: 5 mg/dL — ABNORMAL LOW (ref 6–20)
CO2: 32 mmol/L (ref 22–32)
Calcium: 7.9 mg/dL — ABNORMAL LOW (ref 8.9–10.3)
Chloride: 98 mmol/L (ref 98–111)
Creatinine, Ser: 0.58 mg/dL — ABNORMAL LOW (ref 0.61–1.24)
GFR, Estimated: >60 mL/min (ref >60)
Glucose, Bld: 94 mg/dL (ref 70–99)
Potassium: 4 mmol/L (ref 3.5–5.1)
Sodium: 138 mmol/L (ref 135–145)

## 2019-12-17 LAB — MAGNESIUM: Magnesium: 2 mg/dL (ref 1.7–2.4)

## 2019-12-17 NOTE — Progress Notes (Signed)
Pulmonary Critical Care Medicine [facility] [place]   PULMONARY CRITICAL CARE SERVICE  PROGRESS NOTE  Date of Service: 12/17/2019  Connor Roach  [id number]  DOB: 1960-06-21   DOA: 10/30/2019  Referring Physician: Carron Curie, MD  HPI: Connor Roach is a 59 y.o. male seen for follow up of Acute on Chronic Respiratory Failure.  Patient currently is on pressure support has been on 35% FiO2 with a pressure of 12/5  Medications: Reviewed on Rounds  Physical Exam:  Vitals: Temperature is 96.1 pulse 59 respiratory rate 17 blood pressure is 167/85 saturations 99%  Ventilator Settings on pressure support FiO2 is 35% pressure 12/5  . General: Comfortable at this time . Eyes: Grossly normal lids, irises & conjunctiva . ENT: grossly tongue is normal . Neck: no obvious mass . Cardiovascular: S1 S2 normal no gallop . Respiratory: No rhonchi very coarse breath sounds . Abdomen: soft . Skin: no rash seen on limited exam . Musculoskeletal: not rigid . Psychiatric:unable to assess . Neurologic: no seizure no involuntary movements         Lab Data:   Basic Metabolic Panel: Recent Labs  Lab 12/12/19 0514 12/12/19 0514 12/13/19 0420 12/13/19 1714 12/14/19 0511 12/15/19 1334 12/16/19 0717 12/17/19 0639  NA 147*   < > 148* 145 143 139  --  138  K 4.5   < > 4.5 4.2 4.8 4.5  --  4.0  CL 102   < > 102 100 100 98  --  98  CO2 36*   < > 37* 36* 36* 33*  --  32  GLUCOSE 173*   < > 124* 90 113* 89  --  94  BUN 21*   < > 18 15 11 9   --  5*  CREATININE 0.65   < > 0.60* 0.62 0.66 0.62  --  0.58*  CALCIUM 8.0*   < > 7.9* 7.9* 7.9* 7.7*  --  7.9*  MG 2.0   < > 2.0  --  1.8 1.6* 1.9 2.0  PHOS 4.0  --  3.8 3.8 3.5  --   --  4.2   < > = values in this interval not displayed.    ABG: No results for input(s): PHART, PCO2ART, PO2ART, HCO3, O2SAT in the last 168 hours.  Liver Function Tests: Recent Labs  Lab 12/11/19 0525 12/12/19 0514 12/13/19 1714 12/14/19 0511   ALBUMIN 1.5* 1.5* 1.5* 1.5*   No results for input(s): LIPASE, AMYLASE in the last 168 hours. Recent Labs  Lab 12/10/19 1152 12/13/19 0420  AMMONIA 63* 50*    CBC: Recent Labs  Lab 12/10/19 1152 12/12/19 0514 12/14/19 0511 12/15/19 1334 12/17/19 0639  WBC 5.9 7.1 11.6* 9.9 11.1*  HGB 8.1* 7.8* 8.3* 7.8* 8.1*  HCT 28.3* 27.1* 28.2* 25.9* 26.3*  MCV 100.4* 100.7* 98.3 96.6 96.3  PLT 26* 69* 125* 229 392    Cardiac Enzymes: No results for input(s): CKTOTAL, CKMB, CKMBINDEX, TROPONINI in the last 168 hours.  BNP (last 3 results) No results for input(s): BNP in the last 8760 hours.  ProBNP (last 3 results) No results for input(s): PROBNP in the last 8760 hours.  Radiological Exams: No results found.  Assessment/Plan Active Problems:   Acute on chronic respiratory failure with hypoxia (HCC)   Chronic atrial fibrillation (HCC)   Seizure disorder (HCC)   Metabolic encephalopathy   COPD, severe (HCC)   1. Acute on chronic respiratory failure hypoxia we will continue with pressure support the  goal today is 12 hours 2. Chronic atrial fibrillation rate is controlled continue to monitor 3. Seizure disorder no active seizure noted 4. Metabolic encephalopathy at baseline 5. Severe COPD medical management   I have personally seen and evaluated the patient, evaluated laboratory and imaging results, formulated the assessment and plan and placed orders. The Patient requires high complexity decision making with multiple systems involvement.  Rounds were done with the Respiratory Therapy Director and Staff therapists and discussed with nursing staff also.  Yevonne Pax, MD [facility] Pulmonary Critical Care Medicine Sleep Medicine

## 2019-12-17 NOTE — Progress Notes (Addendum)
IR consulted by [name], NP for evaluation of gastrostomy tube.  History of dysphagia s/p gastrostomy tube placement at outside facility which was inadvertently dislodged s/p percutaneous gastrostomy tube replacement in IIR 12/06/2019. Per order, gastrostomy tube with significant leakage- asking for evaluation.  On PE, patient awake and alert laying in bed, nonverbal at baseline, tracheostomy in place, eyes follow around room. Gastrostomy tube site with multiple saturated dressings/gauze on top- these were removed to expose gastrostomy tube- tube site surrounded with open wound (tender erythematous tissue) with bumper inverted and approximately 4-5 inches away from skin. Site was cleaned, bumper reverted to appropriate position, and cinched to skin to prevent leakage. Discussed with Dr. Grace Isaac who does NOT recommend upsizing gastrostomy tube- no plans for IR procedures at this time, will delete order.  IR recommendations: 1- Continue with wound care management for gastrostomy tube site. 2- NO upsizing recommended at this time (this is a 22 fr tube). 3- Ensure bumper cinched to skin to prevent leakage. 4- Put tube to wall suction if leakage significant. 5- Slow down rate during feedings.  [name], NP made aware of above. Please call IR with questions/concerns.   Connor Boga Joan Avetisyan, PA-C 12/17/2019, 9:57 AM

## 2019-12-18 DIAGNOSIS — G9341 Metabolic encephalopathy: Secondary | ICD-10-CM | POA: Diagnosis not present

## 2019-12-18 DIAGNOSIS — J449 Chronic obstructive pulmonary disease, unspecified: Secondary | ICD-10-CM | POA: Diagnosis not present

## 2019-12-18 DIAGNOSIS — J9621 Acute and chronic respiratory failure with hypoxia: Secondary | ICD-10-CM | POA: Diagnosis not present

## 2019-12-18 DIAGNOSIS — I482 Chronic atrial fibrillation, unspecified: Secondary | ICD-10-CM | POA: Diagnosis not present

## 2019-12-18 NOTE — Progress Notes (Signed)
Pulmonary Critical Care Medicine [facility] [place]   PULMONARY CRITICAL CARE SERVICE  PROGRESS NOTE  Date of Service: 12/18/2019  Connor Roach  GUY:[id number]  DOB: 1960/05/22   DOA: 10/30/2019  Referring Physician: Carron Curie, MD  HPI: Connor Roach is a 59 y.o. male seen for follow up of Acute on Chronic Respiratory Failure.  Patient currently is on pressure for has been on 12/5 with a 16-hour goal today  Medications: Reviewed on Rounds  Physical Exam:  Vitals: Temperature is 96.7 pulse 59 respiratory 18 blood pressure is 169/95 saturations 99%  Ventilator Settings on pressure support FiO2 is 35% pressure 12/5 16-hour goal  . General: Comfortable at this time . Eyes: Grossly normal lids, irises & conjunctiva . ENT: grossly tongue is normal . Neck: no obvious mass . Cardiovascular: S1 S2 normal no gallop . Respiratory: No rhonchi very coarse breath sounds . Abdomen: soft . Skin: no rash seen on limited exam . Musculoskeletal: not rigid . Psychiatric:unable to assess . Neurologic: no seizure no involuntary movements         Lab Data:   Basic Metabolic Panel: Recent Labs  Lab 12/12/19 0514 12/12/19 0514 12/13/19 0420 12/13/19 1714 12/14/19 0511 12/15/19 1334 12/16/19 0717 12/17/19 0639  NA 147*   < > 148* 145 143 139  --  138  K 4.5   < > 4.5 4.2 4.8 4.5  --  4.0  CL 102   < > 102 100 100 98  --  98  CO2 36*   < > 37* 36* 36* 33*  --  32  GLUCOSE 173*   < > 124* 90 113* 89  --  94  BUN 21*   < > 18 15 11 9   --  5*  CREATININE 0.65   < > 0.60* 0.62 0.66 0.62  --  0.58*  CALCIUM 8.0*   < > 7.9* 7.9* 7.9* 7.7*  --  7.9*  MG 2.0   < > 2.0  --  1.8 1.6* 1.9 2.0  PHOS 4.0  --  3.8 3.8 3.5  --   --  4.2   < > = values in this interval not displayed.    ABG: No results for input(s): PHART, PCO2ART, PO2ART, HCO3, O2SAT in the last 168 hours.  Liver Function Tests: Recent Labs  Lab 12/12/19 0514 12/13/19 1714 12/14/19 0511  ALBUMIN 1.5*  1.5* 1.5*   No results for input(s): LIPASE, AMYLASE in the last 168 hours. Recent Labs  Lab 12/13/19 0420  AMMONIA 50*    CBC: Recent Labs  Lab 12/12/19 0514 12/14/19 0511 12/15/19 1334 12/17/19 0639  WBC 7.1 11.6* 9.9 11.1*  HGB 7.8* 8.3* 7.8* 8.1*  HCT 27.1* 28.2* 25.9* 26.3*  MCV 100.7* 98.3 96.6 96.3  PLT 69* 125* 229 392    Cardiac Enzymes: No results for input(s): CKTOTAL, CKMB, CKMBINDEX, TROPONINI in the last 168 hours.  BNP (last 3 results) No results for input(s): BNP in the last 8760 hours.  ProBNP (last 3 results) No results for input(s): PROBNP in the last 8760 hours.  Radiological Exams: No results found.  Assessment/Plan Active Problems:   Acute on chronic respiratory failure with hypoxia (HCC)   Chronic atrial fibrillation (HCC)   Seizure disorder (HCC)   Metabolic encephalopathy   COPD, severe (HCC)   1. Acute on chronic respiratory failure with hypoxia we will continue with pressure support mode titrate oxygen continue pulmonary toilet.  16-hour goal today 2. Chronic atrial  fibrillation rate is controlled 3. Seizure disorder there is no active seizures 4. Metabolic encephalopathy at baseline 5. Severe COPD no change we will continue to monitor closely   I have personally seen and evaluated the patient, evaluated laboratory and imaging results, formulated the assessment and plan and placed orders. The Patient requires high complexity decision making with multiple systems involvement.  Rounds were done with the Respiratory Therapy Director and Staff therapists and discussed with nursing staff also.  Yevonne Pax, MD [facility] Pulmonary Critical Care Medicine Sleep Medicine

## 2019-12-20 DIAGNOSIS — J9621 Acute and chronic respiratory failure with hypoxia: Secondary | ICD-10-CM | POA: Diagnosis not present

## 2019-12-20 DIAGNOSIS — I482 Chronic atrial fibrillation, unspecified: Secondary | ICD-10-CM | POA: Diagnosis not present

## 2019-12-20 DIAGNOSIS — G9341 Metabolic encephalopathy: Secondary | ICD-10-CM | POA: Diagnosis not present

## 2019-12-20 DIAGNOSIS — J449 Chronic obstructive pulmonary disease, unspecified: Secondary | ICD-10-CM | POA: Diagnosis not present

## 2019-12-20 LAB — CULTURE, RESPIRATORY W GRAM STAIN

## 2019-12-20 NOTE — Progress Notes (Signed)
Pulmonary Critical Care Medicine [facility] [place]   PULMONARY CRITICAL CARE SERVICE  PROGRESS NOTE  Date of Service: 12/20/2019  Connor Roach  QMV:[id number]  DOB: [birth year]   DOA: 10/30/2019  Referring Physician: Carron Curie, MD  HPI: Connor Roach is a 59 y.o. male seen for follow up of Acute on Chronic Respiratory Failure.  Patient at this time is on T collar has been on 28% FiO2 goal is 8 hours  Medications: Reviewed on Rounds  Physical Exam:  Vitals: Temperature is 97.2 pulse 63 respiratory 18 blood pressure is 174/93 saturations 96%  Ventilator Settings off the ventilator on T collar  . General: Comfortable at this time . Eyes: Grossly normal lids, irises & conjunctiva . ENT: grossly tongue is normal . Neck: no obvious mass . Cardiovascular: S1 S2 normal no gallop . Respiratory: No rhonchi no rales are noted at this time . Abdomen: soft . Skin: no rash seen on limited exam . Musculoskeletal: not rigid . Psychiatric:unable to assess . Neurologic: no seizure no involuntary movements         Lab Data:   Basic Metabolic Panel: Recent Labs  Lab 12/13/19 1714 12/14/19 0511 12/15/19 1334 12/16/19 0717 12/17/19 0639  NA 145 143 139  --  138  K 4.2 4.8 4.5  --  4.0  CL 100 100 98  --  98  CO2 36* 36* 33*  --  32  GLUCOSE 90 113* 89  --  94  BUN 15 11 9   --  5*  CREATININE 0.62 0.66 0.62  --  0.58*  CALCIUM 7.9* 7.9* 7.7*  --  7.9*  MG  --  1.8 1.6* 1.9 2.0  PHOS 3.8 3.5  --   --  4.2    ABG: No results for input(s): PHART, PCO2ART, PO2ART, HCO3, O2SAT in the last 168 hours.  Liver Function Tests: Recent Labs  Lab 12/13/19 1714 12/14/19 0511  ALBUMIN 1.5* 1.5*   No results for input(s): LIPASE, AMYLASE in the last 168 hours. No results for input(s): AMMONIA in the last 168 hours.  CBC: Recent Labs  Lab 12/14/19 0511 12/15/19 1334 12/17/19 0639  WBC 11.6* 9.9 11.1*  HGB 8.3* 7.8* 8.1*  HCT 28.2* 25.9* 26.3*  MCV 98.3 96.6  96.3  PLT 125* 229 392    Cardiac Enzymes: No results for input(s): CKTOTAL, CKMB, CKMBINDEX, TROPONINI in the last 168 hours.  BNP (last 3 results) No results for input(s): BNP in the last 8760 hours.  ProBNP (last 3 results) No results for input(s): PROBNP in the last 8760 hours.  Radiological Exams: No results found.  Assessment/Plan Active Problems:   Acute on chronic respiratory failure with hypoxia (HCC)   Chronic atrial fibrillation (HCC)   Seizure disorder (HCC)   Metabolic encephalopathy   COPD, severe (HCC)   1. Acute on chronic respiratory failure with hypoxia we will continue with T collar trials goal is 8 hours 2. Chronic atrial fibrillation rate is controlled we will continue to follow 3. Seizure disorder there is no active seizures noted 4. Metabolic encephalopathy no change 5. Severe COPD at baseline continue current management   I have personally seen and evaluated the patient, evaluated laboratory and imaging results, formulated the assessment and plan and placed orders. The Patient requires high complexity decision making with multiple systems involvement.  Rounds were done with the Respiratory Therapy Director and Staff therapists and discussed with nursing staff also.  13/01/21, MD [facility] Pulmonary Critical  Care Medicine Sleep Medicine

## 2019-12-21 DIAGNOSIS — J9621 Acute and chronic respiratory failure with hypoxia: Secondary | ICD-10-CM | POA: Diagnosis not present

## 2019-12-21 DIAGNOSIS — G9341 Metabolic encephalopathy: Secondary | ICD-10-CM | POA: Diagnosis not present

## 2019-12-21 DIAGNOSIS — I482 Chronic atrial fibrillation, unspecified: Secondary | ICD-10-CM | POA: Diagnosis not present

## 2019-12-21 DIAGNOSIS — J449 Chronic obstructive pulmonary disease, unspecified: Secondary | ICD-10-CM | POA: Diagnosis not present

## 2019-12-21 LAB — RENAL FUNCTION PANEL
Albumin: 2 g/dL — ABNORMAL LOW (ref 3.5–5.0)
Anion gap: 9 (ref 5–15)
BUN: 6 mg/dL (ref 6–20)
CO2: 30 mmol/L (ref 22–32)
Calcium: 8.1 mg/dL — ABNORMAL LOW (ref 8.9–10.3)
Chloride: 102 mmol/L (ref 98–111)
Creatinine, Ser: 0.68 mg/dL (ref 0.61–1.24)
GFR, Estimated: >60 mL/min (ref >60)
Glucose, Bld: 100 mg/dL — ABNORMAL HIGH (ref 70–99)
Phosphorus: 3 mg/dL (ref 2.5–4.6)
Potassium: 3.2 mmol/L — ABNORMAL LOW (ref 3.5–5.1)
Sodium: 141 mmol/L (ref 135–145)

## 2019-12-21 LAB — CBC
HCT: 26.6 % — ABNORMAL LOW (ref 39.0–52.0)
Hemoglobin: 8.2 g/dL — ABNORMAL LOW (ref 13.0–17.0)
MCH: 29.8 pg (ref 26.0–34.0)
MCHC: 30.8 g/dL (ref 30.0–36.0)
MCV: 96.7 fL (ref 80.0–100.0)
Platelets: 479 10*3/uL — ABNORMAL HIGH (ref 150–400)
RBC: 2.75 MIL/uL — ABNORMAL LOW (ref 4.22–5.81)
RDW: 17.2 % — ABNORMAL HIGH (ref 11.5–15.5)
WBC: 7.5 10*3/uL (ref 4.0–10.5)
nRBC: 0 % (ref 0.0–0.2)

## 2019-12-21 LAB — MAGNESIUM: Magnesium: 1.5 mg/dL — ABNORMAL LOW (ref 1.7–2.4)

## 2019-12-21 LAB — AMMONIA: Ammonia: 88 umol/L — ABNORMAL HIGH (ref 9–35)

## 2019-12-21 NOTE — Progress Notes (Addendum)
Pulmonary Critical Care Medicine [facility] [place]   PULMONARY CRITICAL CARE SERVICE  PROGRESS NOTE  Date of Service: 12/21/2019  Connor Roach  JJO:[id number]  DOB: [birth year]   DOA: 10/30/2019  Referring Physician: Carron Curie, MD  HPI: Connor Roach is a 59 y.o. male seen for follow up of Acute on Chronic Respiratory Failure. Patient right now is on T collar at the goal today is for 12 hours apparently seems to be doing much better today  Medications: Reviewed on Rounds  Physical Exam:  Vitals: Temperature is 97.4 pulse 63 respiratory rate 20 blood pressure is 161/52 saturations 99%  Ventilator Settings on T collar  . General: Comfortable at this time . Eyes: Grossly normal lids, irises & conjunctiva . ENT: grossly tongue is normal . Neck: no obvious mass . Cardiovascular: S1 S2 normal no gallop . Respiratory: No rhonchi no rales are noted at this time . Abdomen: soft . Skin: no rash seen on limited exam . Musculoskeletal: not rigid . Psychiatric:unable to assess . Neurologic: no seizure no involuntary movements         Lab Data:   Basic Metabolic Panel: Recent Labs  Lab 12/15/19 1334 12/16/19 0717 12/17/19 0639 12/21/19 0252  NA 139  --  138 141  K 4.5  --  4.0 3.2*  CL 98  --  98 102  CO2 33*  --  32 30  GLUCOSE 89  --  94 100*  BUN 9  --  5* 6  CREATININE 0.62  --  0.58* 0.68  CALCIUM 7.7*  --  7.9* 8.1*  MG 1.6* 1.9 2.0 1.5*  PHOS  --   --  4.2 3.0    ABG: No results for input(s): PHART, PCO2ART, PO2ART, HCO3, O2SAT in the last 168 hours.  Liver Function Tests: Recent Labs  Lab 12/21/19 0252  ALBUMIN 2.0*   No results for input(s): LIPASE, AMYLASE in the last 168 hours. Recent Labs  Lab 12/21/19 0252  AMMONIA 88*    CBC: Recent Labs  Lab 12/15/19 1334 12/17/19 0639 12/21/19 0252  WBC 9.9 11.1* 7.5  HGB 7.8* 8.1* 8.2*  HCT 25.9* 26.3* 26.6*  MCV 96.6 96.3 96.7  PLT 229 392 479*    Cardiac Enzymes: No results  for input(s): CKTOTAL, CKMB, CKMBINDEX, TROPONINI in the last 168 hours.  BNP (last 3 results) No results for input(s): BNP in the last 8760 hours.  ProBNP (last 3 results) No results for input(s): PROBNP in the last 8760 hours.  Radiological Exams: No results found.  Assessment/Plan Active Problems:   Acute on chronic respiratory failure with hypoxia (HCC)   Chronic atrial fibrillation (HCC)   Seizure disorder (HCC)   Metabolic encephalopathy   COPD, severe (HCC)   1. Acute on chronic respiratory failure hypoxia goal is 12 hours on T collar 2. Chronic atrial fibrillation rate is controlled 3. Seizure disorder no active seizures noted at this time. 4. Metabolic encephalopathy patient at baseline 5. Severe COPD will continue with present management.   I have personally seen and evaluated the patient, evaluated laboratory and imaging results, formulated the assessment and plan and placed orders. The Patient requires high complexity decision making with multiple systems involvement.  Rounds were done with the Respiratory Therapy Director and Staff therapists and discussed with nursing staff also.  Yevonne Pax, MD [facility] Pulmonary Critical Care Medicine Sleep Medicine

## 2019-12-22 LAB — MAGNESIUM
Magnesium: 2.1 mg/dL (ref 1.7–2.4)
Magnesium: 2 mg/dL (ref 1.7–2.4)

## 2019-12-22 LAB — POTASSIUM: Potassium: 4.6 mmol/L (ref 3.5–5.1)

## 2019-12-23 DIAGNOSIS — G9341 Metabolic encephalopathy: Secondary | ICD-10-CM | POA: Diagnosis not present

## 2019-12-23 DIAGNOSIS — I482 Chronic atrial fibrillation, unspecified: Secondary | ICD-10-CM | POA: Diagnosis not present

## 2019-12-23 DIAGNOSIS — J9621 Acute and chronic respiratory failure with hypoxia: Secondary | ICD-10-CM | POA: Diagnosis not present

## 2019-12-23 DIAGNOSIS — J449 Chronic obstructive pulmonary disease, unspecified: Secondary | ICD-10-CM | POA: Diagnosis not present

## 2019-12-23 LAB — TRIGLYCERIDES: Triglycerides: 69 mg/dL (ref <150)

## 2019-12-23 NOTE — Progress Notes (Signed)
Pulmonary Critical Care Medicine [facility] [place]   PULMONARY CRITICAL CARE SERVICE  PROGRESS NOTE  Date of Service: 12/23/2019  Connor Roach  [id number]:[id number]  DOB: 04/08/1960   DOA: 10/30/2019  Referring Physician: Carron Curie, MD  HPI: Connor Roach is a 59 y.o. male seen for follow up of Acute on Chronic Respiratory Failure.  Patient currently is on T collar has been on 45% FiO2 with good saturations.  Medications: Reviewed on Rounds  Physical Exam:  Vitals: Temperature is 96.4 pulse 57 respiratory rate is 25 blood pressure is 140/75 saturations 100%  Ventilator Settings of the ventilator on T collar FiO2 45%  . General: Comfortable at this time . Eyes: Grossly normal lids, irises & conjunctiva . ENT: grossly tongue is normal . Neck: no obvious mass . Cardiovascular: S1 S2 normal no gallop . Respiratory: No rhonchi very coarse breath sounds . Abdomen: soft . Skin: no rash seen on limited exam . Musculoskeletal: not rigid . Psychiatric:unable to assess . Neurologic: no seizure no involuntary movements         Lab Data:   Basic Metabolic Panel: Recent Labs  Lab 12/17/19 0639 12/21/19 0252 12/22/19 0245 12/22/19 0616  NA 138 141  --   --   K 4.0 3.2* 4.6  --   CL 98 102  --   --   CO2 32 30  --   --   GLUCOSE 94 100*  --   --   BUN 5* 6  --   --   CREATININE 0.58* 0.68  --   --   CALCIUM 7.9* 8.1*  --   --   MG 2.0 1.5* 2.1 2.0  PHOS 4.2 3.0  --   --     ABG: No results for input(s): PHART, PCO2ART, PO2ART, HCO3, O2SAT in the last 168 hours.  Liver Function Tests: Recent Labs  Lab 12/21/19 0252  ALBUMIN 2.0*   No results for input(s): LIPASE, AMYLASE in the last 168 hours. Recent Labs  Lab 12/21/19 0252  AMMONIA 88*    CBC: Recent Labs  Lab 12/17/19 0639 12/21/19 0252  WBC 11.1* 7.5  HGB 8.1* 8.2*  HCT 26.3* 26.6*  MCV 96.3 96.7  PLT 392 479*    Cardiac Enzymes: No results for input(s): CKTOTAL, CKMB, CKMBINDEX,  TROPONINI in the last 168 hours.  BNP (last 3 results) No results for input(s): BNP in the last 8760 hours.  ProBNP (last 3 results) No results for input(s): PROBNP in the last 8760 hours.  Radiological Exams: No results found.  Assessment/Plan Active Problems:   Acute on chronic respiratory failure with hypoxia (HCC)   Chronic atrial fibrillation (HCC)   Seizure disorder (HCC)   Metabolic encephalopathy   COPD, severe (HCC)   1. Acute on chronic respiratory failure hypoxia continue with T collar trials titrate oxygen continue pulmonary toilet. 2. Chronic atrial fibrillation rate is controlled 3. Seizure disorder no active seizures noted at this time. 4. Metabolic encephalopathy he is at baseline 5. Severe COPD at baseline we will continue to monitor   I have personally seen and evaluated the patient, evaluated laboratory and imaging results, formulated the assessment and plan and placed orders. The Patient requires high complexity decision making with multiple systems involvement.  Rounds were done with the Respiratory Therapy Director and Staff therapists and discussed with nursing staff also.  Yevonne Pax, MD [facility] Pulmonary Critical Care Medicine Sleep Medicine

## 2019-12-24 DIAGNOSIS — G9341 Metabolic encephalopathy: Secondary | ICD-10-CM | POA: Diagnosis not present

## 2019-12-24 DIAGNOSIS — I482 Chronic atrial fibrillation, unspecified: Secondary | ICD-10-CM | POA: Diagnosis not present

## 2019-12-24 DIAGNOSIS — J449 Chronic obstructive pulmonary disease, unspecified: Secondary | ICD-10-CM | POA: Diagnosis not present

## 2019-12-24 DIAGNOSIS — J9621 Acute and chronic respiratory failure with hypoxia: Secondary | ICD-10-CM | POA: Diagnosis not present

## 2019-12-24 NOTE — Progress Notes (Signed)
Pulmonary Critical Care Medicine [facility] [place]   PULMONARY CRITICAL CARE SERVICE  PROGRESS NOTE  Date of Service: 12/24/2019  Connor Roach  FBP:[id number]  DOB: November 13, 1960   DOA: 10/30/2019  Referring Physician: Carron Curie, MD  HPI: Connor Roach is a 59 y.o. male seen for follow up of Acute on Chronic Respiratory Failure.  Patient currently is on T collar has been on 28% FiO2 goal today is for 4 hours  Medications: Reviewed on Rounds  Physical Exam:  Vitals: Temperature is 97.1 pulse 67 respiratory 22 blood pressure is 163/77 saturations 99%  Ventilator Settings on T collar FiO2 28%  . General: Comfortable at this time . Eyes: Grossly normal lids, irises & conjunctiva . ENT: grossly tongue is normal . Neck: no obvious mass . Cardiovascular: S1 S2 normal no gallop . Respiratory: No rales noted at this time . Abdomen: soft . Skin: no rash seen on limited exam . Musculoskeletal: not rigid . Psychiatric:unable to assess . Neurologic: no seizure no involuntary movements         Lab Data:   Basic Metabolic Panel: Recent Labs  Lab 12/21/19 0252 12/22/19 0245 12/22/19 0616  NA 141  --   --   K 3.2* 4.6  --   CL 102  --   --   CO2 30  --   --   GLUCOSE 100*  --   --   BUN 6  --   --   CREATININE 0.68  --   --   CALCIUM 8.1*  --   --   MG 1.5* 2.1 2.0  PHOS 3.0  --   --     ABG: No results for input(s): PHART, PCO2ART, PO2ART, HCO3, O2SAT in the last 168 hours.  Liver Function Tests: Recent Labs  Lab 12/21/19 0252  ALBUMIN 2.0*   No results for input(s): LIPASE, AMYLASE in the last 168 hours. Recent Labs  Lab 12/21/19 0252  AMMONIA 88*    CBC: Recent Labs  Lab 12/21/19 0252  WBC 7.5  HGB 8.2*  HCT 26.6*  MCV 96.7  PLT 479*    Cardiac Enzymes: No results for input(s): CKTOTAL, CKMB, CKMBINDEX, TROPONINI in the last 168 hours.  BNP (last 3 results) No results for input(s): BNP in the last 8760 hours.  ProBNP (last 3  results) No results for input(s): PROBNP in the last 8760 hours.  Radiological Exams: No results found.  Assessment/Plan Active Problems:   Acute on chronic respiratory failure with hypoxia (HCC)   Chronic atrial fibrillation (HCC)   Seizure disorder (HCC)   Metabolic encephalopathy   COPD, severe (HCC)   1. Acute on chronic respiratory failure hypoxia we will continue with T-piece goal is for 4-hour wean today 2. Chronic atrial fibrillation rate is controlled we will continue to monitor  3. seizure disorder no active seizures noted at this time 4. Metabolic encephalopathy no change 5. Severe COPD we will continue with medical management   I have personally seen and evaluated the patient, evaluated laboratory and imaging results, formulated the assessment and plan and placed orders. The Patient requires high complexity decision making with multiple systems involvement.  Rounds were done with the Respiratory Therapy Director and Staff therapists and discussed with nursing staff also.  Yevonne Pax, MD [facility] Pulmonary Critical Care Medicine Sleep Medicine

## 2019-12-25 LAB — PHOSPHORUS: Phosphorus: 3 mg/dL (ref 2.5–4.6)

## 2019-12-25 LAB — BASIC METABOLIC PANEL
Anion gap: 10 (ref 5–15)
BUN: 9 mg/dL (ref 6–20)
CO2: 30 mmol/L (ref 22–32)
Calcium: 8.3 mg/dL — ABNORMAL LOW (ref 8.9–10.3)
Chloride: 103 mmol/L (ref 98–111)
Creatinine, Ser: 0.57 mg/dL — ABNORMAL LOW (ref 0.61–1.24)
GFR, Estimated: >60 mL/min (ref >60)
Glucose, Bld: 86 mg/dL (ref 70–99)
Potassium: 3.9 mmol/L (ref 3.5–5.1)
Sodium: 143 mmol/L (ref 135–145)

## 2019-12-25 LAB — URINALYSIS, ROUTINE W REFLEX MICROSCOPIC
Bilirubin Urine: NEGATIVE
Glucose, UA: NEGATIVE mg/dL
Hgb urine dipstick: NEGATIVE
Ketones, ur: NEGATIVE mg/dL
Leukocytes,Ua: NEGATIVE
Nitrite: NEGATIVE
Protein, ur: 30 mg/dL — AB
Specific Gravity, Urine: 1.015 (ref 1.005–1.030)
pH: 8 (ref 5.0–8.0)

## 2019-12-25 LAB — CBC
HCT: 31.2 % — ABNORMAL LOW (ref 39.0–52.0)
Hemoglobin: 9.2 g/dL — ABNORMAL LOW (ref 13.0–17.0)
MCH: 28.7 pg (ref 26.0–34.0)
MCHC: 29.5 g/dL — ABNORMAL LOW (ref 30.0–36.0)
MCV: 97.2 fL (ref 80.0–100.0)
Platelets: 300 10*3/uL (ref 150–400)
RBC: 3.21 MIL/uL — ABNORMAL LOW (ref 4.22–5.81)
RDW: 16.9 % — ABNORMAL HIGH (ref 11.5–15.5)
WBC: 13.1 10*3/uL — ABNORMAL HIGH (ref 4.0–10.5)
nRBC: 0 % (ref 0.0–0.2)

## 2019-12-25 LAB — VALPROIC ACID LEVEL: Valproic Acid Lvl: 60 ug/mL (ref 50.0–100.0)

## 2019-12-25 LAB — MAGNESIUM: Magnesium: 2 mg/dL (ref 1.7–2.4)

## 2019-12-26 ENCOUNTER — Other Ambulatory Visit (HOSPITAL_COMMUNITY): Payer: BC Managed Care – PPO

## 2019-12-26 DIAGNOSIS — I482 Chronic atrial fibrillation, unspecified: Secondary | ICD-10-CM | POA: Diagnosis not present

## 2019-12-26 DIAGNOSIS — J449 Chronic obstructive pulmonary disease, unspecified: Secondary | ICD-10-CM | POA: Diagnosis not present

## 2019-12-26 DIAGNOSIS — G9341 Metabolic encephalopathy: Secondary | ICD-10-CM | POA: Diagnosis not present

## 2019-12-26 DIAGNOSIS — J9621 Acute and chronic respiratory failure with hypoxia: Secondary | ICD-10-CM | POA: Diagnosis not present

## 2019-12-26 LAB — MAGNESIUM: Magnesium: 1.8 mg/dL (ref 1.7–2.4)

## 2019-12-26 LAB — BASIC METABOLIC PANEL
Anion gap: 8 (ref 5–15)
BUN: 7 mg/dL (ref 6–20)
CO2: 30 mmol/L (ref 22–32)
Calcium: 8.3 mg/dL — ABNORMAL LOW (ref 8.9–10.3)
Chloride: 101 mmol/L (ref 98–111)
Creatinine, Ser: 0.56 mg/dL — ABNORMAL LOW (ref 0.61–1.24)
GFR, Estimated: >60 mL/min (ref >60)
Glucose, Bld: 96 mg/dL (ref 70–99)
Potassium: 3.8 mmol/L (ref 3.5–5.1)
Sodium: 139 mmol/L (ref 135–145)

## 2019-12-26 LAB — CBC
HCT: 30.4 % — ABNORMAL LOW (ref 39.0–52.0)
Hemoglobin: 9.3 g/dL — ABNORMAL LOW (ref 13.0–17.0)
MCH: 29.3 pg (ref 26.0–34.0)
MCHC: 30.6 g/dL (ref 30.0–36.0)
MCV: 95.9 fL (ref 80.0–100.0)
Platelets: 239 10*3/uL (ref 150–400)
RBC: 3.17 MIL/uL — ABNORMAL LOW (ref 4.22–5.81)
RDW: 16.2 % — ABNORMAL HIGH (ref 11.5–15.5)
WBC: 9.7 10*3/uL (ref 4.0–10.5)
nRBC: 0 % (ref 0.0–0.2)

## 2019-12-26 LAB — URINE CULTURE: Culture: NO GROWTH

## 2019-12-26 LAB — PHOSPHORUS: Phosphorus: 3.7 mg/dL (ref 2.5–4.6)

## 2019-12-26 NOTE — Progress Notes (Signed)
Pulmonary Critical Care Medicine [facility] [place]   PULMONARY CRITICAL CARE SERVICE  PROGRESS NOTE  Date of Service: 12/26/2019  Connor Roach  NLZ:[id number]  DOB: 05-Jan-1961   DOA: 10/30/2019  Referring Physician: Carron Curie, MD  HPI: Connor Roach is a 59 y.o. male seen for follow up of Acute on Chronic Respiratory Failure.  Currently is on T collar is on 20% FiO2 with a goal of 48 hours  Medications: Reviewed on Rounds  Physical Exam:  Vitals: Temperature is 98.0 pulse 70 respiratory 21 blood pressure is 167/87 saturations 100%  Ventilator Settings off the ventilator on T collar  . General: Comfortable at this time . Eyes: Grossly normal lids, irises & conjunctiva . ENT: grossly tongue is normal . Neck: no obvious mass . Cardiovascular: S1 S2 normal no gallop . Respiratory: No rhonchi or rales are noted at this time . Abdomen: soft . Skin: no rash seen on limited exam . Musculoskeletal: not rigid . Psychiatric:unable to assess . Neurologic: no seizure no involuntary movements         Lab Data:   Basic Metabolic Panel: Recent Labs  Lab 12/21/19 0252 12/22/19 0245 12/22/19 0616 12/25/19 0659 12/26/19 0434  NA 141  --   --  143 139  K 3.2* 4.6  --  3.9 3.8  CL 102  --   --  103 101  CO2 30  --   --  30 30  GLUCOSE 100*  --   --  86 96  BUN 6  --   --  9 7  CREATININE 0.68  --   --  0.57* 0.56*  CALCIUM 8.1*  --   --  8.3* 8.3*  MG 1.5* 2.1 2.0 2.0 1.8  PHOS 3.0  --   --  3.0 3.7    ABG: No results for input(s): PHART, PCO2ART, PO2ART, HCO3, O2SAT in the last 168 hours.  Liver Function Tests: Recent Labs  Lab 12/21/19 0252  ALBUMIN 2.0*   No results for input(s): LIPASE, AMYLASE in the last 168 hours. Recent Labs  Lab 12/21/19 0252  AMMONIA 88*    CBC: Recent Labs  Lab 12/21/19 0252 12/25/19 0659 12/26/19 0434  WBC 7.5 13.1* 9.7  HGB 8.2* 9.2* 9.3*  HCT 26.6* 31.2* 30.4*  MCV 96.7 97.2 95.9  PLT 479* 300 239     Cardiac Enzymes: No results for input(s): CKTOTAL, CKMB, CKMBINDEX, TROPONINI in the last 168 hours.  BNP (last 3 results) No results for input(s): BNP in the last 8760 hours.  ProBNP (last 3 results) No results for input(s): PROBNP in the last 8760 hours.  Radiological Exams: DG Chest Port 1 View  Result Date: 12/26/2019 CLINICAL DATA:  Pneumonia. EXAM: PORTABLE CHEST 1 VIEW COMPARISON:  12/15/2019 FINDINGS: The tracheostomy tube is stable. The right-sided PICC line has been removed since the prior x-ray. Much improved bibasilar aeration with resolution of basilar infiltrates. No pleural effusions or pneumothorax. IMPRESSION: Much improved bibasilar aeration with resolution of basilar infiltrates. Electronically Signed   By: Rudie Meyer M.D.   On: 12/26/2019 06:45    Assessment/Plan Active Problems:   Acute on chronic respiratory failure with hypoxia (HCC)   Chronic atrial fibrillation (HCC)   Seizure disorder (HCC)   Metabolic encephalopathy   COPD, severe (HCC)   1. Acute on chronic respiratory failure hypoxia continue with T-piece titrate oxygen continue pulmonary toilet. 2. Chronic atrial fibrillation rate is controlled we will continue with present management 3. Seizure  disorder no active seizure noted 4. Metabolic encephalopathy no change 5. Severe COPD at baseline   I have personally seen and evaluated the patient, evaluated laboratory and imaging results, formulated the assessment and plan and placed orders. The Patient requires high complexity decision making with multiple systems involvement.  Rounds were done with the Respiratory Therapy Director and Staff therapists and discussed with nursing staff also.  Yevonne Pax, MD [facility] Pulmonary Critical Care Medicine Sleep Medicine

## 2019-12-27 DIAGNOSIS — J9621 Acute and chronic respiratory failure with hypoxia: Secondary | ICD-10-CM | POA: Diagnosis not present

## 2019-12-27 DIAGNOSIS — G9341 Metabolic encephalopathy: Secondary | ICD-10-CM | POA: Diagnosis not present

## 2019-12-27 DIAGNOSIS — J449 Chronic obstructive pulmonary disease, unspecified: Secondary | ICD-10-CM | POA: Diagnosis not present

## 2019-12-27 DIAGNOSIS — I482 Chronic atrial fibrillation, unspecified: Secondary | ICD-10-CM | POA: Diagnosis not present

## 2019-12-27 LAB — BLOOD GAS, ARTERIAL
Acid-Base Excess: 8 mmol/L — ABNORMAL HIGH (ref 0.0–2.0)
Bicarbonate: 32.8 mmol/L — ABNORMAL HIGH (ref 20.0–28.0)
FIO2: 28
O2 Saturation: 98.2 %
Patient temperature: 36
pCO2 arterial: 51 mmHg — ABNORMAL HIGH (ref 32.0–48.0)
pH, Arterial: 7.419 (ref 7.350–7.450)
pO2, Arterial: 96 mmHg (ref 83.0–108.0)

## 2019-12-27 LAB — LEVETIRACETAM LEVEL: Levetiracetam Lvl: 19.1 ug/mL (ref 10.0–40.0)

## 2019-12-27 NOTE — Progress Notes (Signed)
Pulmonary Critical Care Medicine [facility] [place]   PULMONARY CRITICAL CARE SERVICE  PROGRESS NOTE  Date of Service: 12/27/2019  Connor Roach  TWS:[id number]  DOB: 06/30/1960   DOA: 10/30/2019  Referring Physician: Carron Curie, MD  HPI: Connor Roach is a 59 y.o. male seen for follow up of Acute on Chronic Respiratory Failure.  Patient right now is on T collar on 28% FiO2 48-hour goal  Medications: Reviewed on Rounds  Physical Exam:  Vitals: Temperature is 98.8 pulse sixty-three respiratory rate sixteen blood pressure is 111/61 saturations 100%  Ventilator Settings on T collar with FiO2 28%  . General: Comfortable at this time . Eyes: Grossly normal lids, irises & conjunctiva . ENT: grossly tongue is normal . Neck: no obvious mass . Cardiovascular: S1 S2 normal no gallop . Respiratory: No rhonchi very coarse breath sounds . Abdomen: soft . Skin: no rash seen on limited exam . Musculoskeletal: not rigid . Psychiatric:unable to assess . Neurologic: no seizure no involuntary movements         Lab Data:   Basic Metabolic Panel: Recent Labs  Lab 12/21/19 0252 12/22/19 0245 12/22/19 0616 12/25/19 0659 12/26/19 0434  NA 141  --   --  143 139  K 3.2* 4.6  --  3.9 3.8  CL 102  --   --  103 101  CO2 30  --   --  30 30  GLUCOSE 100*  --   --  86 96  BUN 6  --   --  9 7  CREATININE 0.68  --   --  0.57* 0.56*  CALCIUM 8.1*  --   --  8.3* 8.3*  MG 1.5* 2.1 2.0 2.0 1.8  PHOS 3.0  --   --  3.0 3.7    ABG: Recent Labs  Lab 12/27/19 0943  PHART 7.419  PCO2ART 51.0*  PO2ART 96.0  HCO3 32.8*  O2SAT 98.2    Liver Function Tests: Recent Labs  Lab 12/21/19 0252  ALBUMIN 2.0*   No results for input(s): LIPASE, AMYLASE in the last 168 hours. Recent Labs  Lab 12/21/19 0252  AMMONIA 88*    CBC: Recent Labs  Lab 12/21/19 0252 12/25/19 0659 12/26/19 0434  WBC 7.5 13.1* 9.7  HGB 8.2* 9.2* 9.3*  HCT 26.6* 31.2* 30.4*  MCV 96.7 97.2 95.9   PLT 479* 300 239    Cardiac Enzymes: No results for input(s): CKTOTAL, CKMB, CKMBINDEX, TROPONINI in the last 168 hours.  BNP (last 3 results) No results for input(s): BNP in the last 8760 hours.  ProBNP (last 3 results) No results for input(s): PROBNP in the last 8760 hours.  Radiological Exams: DG Chest Port 1 View  Result Date: 12/26/2019 CLINICAL DATA:  Pneumonia. EXAM: PORTABLE CHEST 1 VIEW COMPARISON:  12/15/2019 FINDINGS: The tracheostomy tube is stable. The right-sided PICC line has been removed since the prior x-ray. Much improved bibasilar aeration with resolution of basilar infiltrates. No pleural effusions or pneumothorax. IMPRESSION: Much improved bibasilar aeration with resolution of basilar infiltrates. Electronically Signed   By: Rudie Meyer M.D.   On: 12/26/2019 06:45    Assessment/Plan Active Problems:   Acute on chronic respiratory failure with hypoxia (HCC)   Chronic atrial fibrillation (HCC)   Seizure disorder (HCC)   Metabolic encephalopathy   COPD, severe (HCC)   1. Acute on chronic respiratory failure hypoxia we will continue T collar trials patient goal was 48 hours 2. Chronic atrial fibrillation rate is controlled 3.  Seizure disorder no active seizures noted at this time. 4. Metabolic encephalopathy is at baseline 5. Severe COPD medical management   I have personally seen and evaluated the patient, evaluated laboratory and imaging results, formulated the assessment and plan and placed orders. The Patient requires high complexity decision making with multiple systems involvement.  Rounds were done with the Respiratory Therapy Director and Staff therapists and discussed with nursing staff also.  Yevonne Pax, MD [facility] Pulmonary Critical Care Medicine Sleep Medicine

## 2019-12-28 DIAGNOSIS — J9621 Acute and chronic respiratory failure with hypoxia: Secondary | ICD-10-CM | POA: Diagnosis not present

## 2019-12-28 DIAGNOSIS — J449 Chronic obstructive pulmonary disease, unspecified: Secondary | ICD-10-CM | POA: Diagnosis not present

## 2019-12-28 DIAGNOSIS — G9341 Metabolic encephalopathy: Secondary | ICD-10-CM | POA: Diagnosis not present

## 2019-12-28 DIAGNOSIS — I482 Chronic atrial fibrillation, unspecified: Secondary | ICD-10-CM | POA: Diagnosis not present

## 2019-12-28 LAB — BASIC METABOLIC PANEL
Anion gap: 8 (ref 5–15)
BUN: 10 mg/dL (ref 6–20)
CO2: 33 mmol/L — ABNORMAL HIGH (ref 22–32)
Calcium: 8.2 mg/dL — ABNORMAL LOW (ref 8.9–10.3)
Chloride: 101 mmol/L (ref 98–111)
Creatinine, Ser: 0.59 mg/dL — ABNORMAL LOW (ref 0.61–1.24)
GFR, Estimated: >60 mL/min (ref >60)
Glucose, Bld: 95 mg/dL (ref 70–99)
Potassium: 3.8 mmol/L (ref 3.5–5.1)
Sodium: 142 mmol/L (ref 135–145)

## 2019-12-28 LAB — CBC
HCT: 32.3 % — ABNORMAL LOW (ref 39.0–52.0)
Hemoglobin: 9.6 g/dL — ABNORMAL LOW (ref 13.0–17.0)
MCH: 28.8 pg (ref 26.0–34.0)
MCHC: 29.7 g/dL — ABNORMAL LOW (ref 30.0–36.0)
MCV: 97 fL (ref 80.0–100.0)
Platelets: 159 10*3/uL (ref 150–400)
RBC: 3.33 MIL/uL — ABNORMAL LOW (ref 4.22–5.81)
RDW: 15.7 % — ABNORMAL HIGH (ref 11.5–15.5)
WBC: 9.8 10*3/uL (ref 4.0–10.5)
nRBC: 0 % (ref 0.0–0.2)

## 2019-12-28 LAB — CULTURE, RESPIRATORY W GRAM STAIN

## 2019-12-28 LAB — PHOSPHORUS: Phosphorus: 3.6 mg/dL (ref 2.5–4.6)

## 2019-12-28 LAB — AMMONIA: Ammonia: 45 umol/L — ABNORMAL HIGH (ref 9–35)

## 2019-12-28 LAB — MAGNESIUM: Magnesium: 2 mg/dL (ref 1.7–2.4)

## 2019-12-28 NOTE — Progress Notes (Signed)
Pulmonary Critical Care Medicine [facility] [place]   PULMONARY CRITICAL CARE SERVICE  PROGRESS NOTE  Date of Service: 12/28/2019  JMARI PELC  TWS:[id number]  DOB: 1960/12/28   DOA: 10/30/2019  Referring Physician: Carron Curie, MD  HPI: RENOLD KOZAR is a 59 y.o. male seen for follow up of Acute on Chronic Respiratory Failure.  Patient at this time is on T collar has been on 28% FiO2 with good saturations.  Medications: Reviewed on Rounds  Physical Exam:  Vitals: Temperature is 96.5 pulse 65 respiratory 16 blood pressure is 134/69 saturations 100%  Ventilator Settings on T collar with an FiO2 of 28%  . General: Comfortable at this time . Eyes: Grossly normal lids, irises & conjunctiva . ENT: grossly tongue is normal . Neck: no obvious mass . Cardiovascular: S1 S2 normal no gallop . Respiratory: No rhonchi no rales . At this time. . Abdomen: soft . Skin: no rash seen on limited exam . Musculoskeletal: not rigid . Psychiatric:unable to assess . Neurologic: no seizure no involuntary movements         Lab Data:   Basic Metabolic Panel: Recent Labs  Lab 12/22/19 0245 12/22/19 0616 12/25/19 0659 12/26/19 0434 12/28/19 0604  NA  --   --  143 139 142  K 4.6  --  3.9 3.8 3.8  CL  --   --  103 101 101  CO2  --   --  30 30 33*  GLUCOSE  --   --  86 96 95  BUN  --   --  9 7 10   CREATININE  --   --  0.57* 0.56* 0.59*  CALCIUM  --   --  8.3* 8.3* 8.2*  MG 2.1 2.0 2.0 1.8 2.0  PHOS  --   --  3.0 3.7 3.6    ABG: Recent Labs  Lab 12/27/19 0943  PHART 7.419  PCO2ART 51.0*  PO2ART 96.0  HCO3 32.8*  O2SAT 98.2    Liver Function Tests: No results for input(s): AST, ALT, ALKPHOS, BILITOT, PROT, ALBUMIN in the last 168 hours. No results for input(s): LIPASE, AMYLASE in the last 168 hours. Recent Labs  Lab 12/28/19 0604  AMMONIA 45*    CBC: Recent Labs  Lab 12/25/19 0659 12/26/19 0434 12/28/19 0604  WBC 13.1* 9.7 9.8  HGB 9.2* 9.3* 9.6*   HCT 31.2* 30.4* 32.3*  MCV 97.2 95.9 97.0  PLT 300 239 159    Cardiac Enzymes: No results for input(s): CKTOTAL, CKMB, CKMBINDEX, TROPONINI in the last 168 hours.  BNP (last 3 results) No results for input(s): BNP in the last 8760 hours.  ProBNP (last 3 results) No results for input(s): PROBNP in the last 8760 hours.  Radiological Exams: No results found.  Assessment/Plan Active Problems:   Acute on chronic respiratory failure with hypoxia (HCC)   Chronic atrial fibrillation (HCC)   Seizure disorder (HCC)   Metabolic encephalopathy   COPD, severe (HCC)   1. Acute on chronic respiratory failure with hypoxia patient continues on T-piece secretions are still significant we will continue to advance 2. Chronic atrial fibrillation rate is controlled 3. Seizure disorder no active seizures 4. Metabolic encephalopathy no change 5. Severe COPD medical management   I have personally seen and evaluated the patient, evaluated laboratory and imaging results, formulated the assessment and plan and placed orders. The Patient requires high complexity decision making with multiple systems involvement.  Rounds were done with the Respiratory Therapy Director and Staff therapists  and discussed with nursing staff also.  Allyne Gee, MD [facility] Pulmonary Critical Care Medicine Sleep Medicine

## 2019-12-29 DIAGNOSIS — J449 Chronic obstructive pulmonary disease, unspecified: Secondary | ICD-10-CM | POA: Diagnosis not present

## 2019-12-29 DIAGNOSIS — G9341 Metabolic encephalopathy: Secondary | ICD-10-CM | POA: Diagnosis not present

## 2019-12-29 DIAGNOSIS — I482 Chronic atrial fibrillation, unspecified: Secondary | ICD-10-CM | POA: Diagnosis not present

## 2019-12-29 DIAGNOSIS — J9621 Acute and chronic respiratory failure with hypoxia: Secondary | ICD-10-CM | POA: Diagnosis not present

## 2019-12-29 NOTE — Progress Notes (Signed)
Pulmonary Critical Care Medicine [facility] [place]   PULMONARY CRITICAL CARE SERVICE  PROGRESS NOTE  Date of Service: 12/29/2019  Connor Roach  YOV:[id number]  DOB: 01-23-1961   DOA: 10/30/2019  Referring Physician: Carron Curie, MD  HPI: Connor Roach is a 59 y.o. male seen for follow up of Acute on Chronic Respiratory Failure.  Patient currently is on T collar has been on 28% FiO2 at baseline  Medications: Reviewed on Rounds  Physical Exam:  Vitals: Temperature is 96.3 pulse 65 respiratory rate 16 blood pressure is 139/78 saturations 100%  Ventilator Settings on T collar FiO2 28%  . General: Comfortable at this time . Eyes: Grossly normal lids, irises & conjunctiva . ENT: grossly tongue is normal . Neck: no obvious mass . Cardiovascular: S1 S2 normal no gallop . Respiratory: No rhonchi very coarse breath sounds . Abdomen: soft . Skin: no rash seen on limited exam . Musculoskeletal: not rigid . Psychiatric:unable to assess . Neurologic: no seizure no involuntary movements         Lab Data:   Basic Metabolic Panel: Recent Labs  Lab 12/25/19 0659 12/26/19 0434 12/28/19 0604  NA 143 139 142  K 3.9 3.8 3.8  CL 103 101 101  CO2 30 30 33*  GLUCOSE 86 96 95  BUN 9 7 10   CREATININE 0.57* 0.56* 0.59*  CALCIUM 8.3* 8.3* 8.2*  MG 2.0 1.8 2.0  PHOS 3.0 3.7 3.6    ABG: Recent Labs  Lab 12/27/19 0943  PHART 7.419  PCO2ART 51.0*  PO2ART 96.0  HCO3 32.8*  O2SAT 98.2    Liver Function Tests: No results for input(s): AST, ALT, ALKPHOS, BILITOT, PROT, ALBUMIN in the last 168 hours. No results for input(s): LIPASE, AMYLASE in the last 168 hours. Recent Labs  Lab 12/28/19 0604  AMMONIA 45*    CBC: Recent Labs  Lab 12/25/19 0659 12/26/19 0434 12/28/19 0604  WBC 13.1* 9.7 9.8  HGB 9.2* 9.3* 9.6*  HCT 31.2* 30.4* 32.3*  MCV 97.2 95.9 97.0  PLT 300 239 159    Cardiac Enzymes: No results for input(s): CKTOTAL, CKMB, CKMBINDEX,  TROPONINI in the last 168 hours.  BNP (last 3 results) No results for input(s): BNP in the last 8760 hours.  ProBNP (last 3 results) No results for input(s): PROBNP in the last 8760 hours.  Radiological Exams: No results found.  Assessment/Plan Active Problems:   Acute on chronic respiratory failure with hypoxia (HCC)   Chronic atrial fibrillation (HCC)   Seizure disorder (HCC)   Metabolic encephalopathy   COPD, severe (HCC)   1. Acute on chronic respiratory failure hypoxia we will continue with T-piece titrate oxygen continue pulmonary toilet 2. Chronic atrial fibrillation rate is controlled 3. Seizure disorder no active seizures 4. Metabolic encephalopathy versus change 5. Severe COPD patient is at baseline   I have personally seen and evaluated the patient, evaluated laboratory and imaging results, formulated the assessment and plan and placed orders. The Patient requires high complexity decision making with multiple systems involvement.  Rounds were done with the Respiratory Therapy Director and Staff therapists and discussed with nursing staff also.  13/12/21, MD [facility] Pulmonary Critical Care Medicine Sleep Medicine

## 2019-12-30 DIAGNOSIS — J9621 Acute and chronic respiratory failure with hypoxia: Secondary | ICD-10-CM | POA: Diagnosis not present

## 2019-12-30 DIAGNOSIS — G9341 Metabolic encephalopathy: Secondary | ICD-10-CM | POA: Diagnosis not present

## 2019-12-30 DIAGNOSIS — J449 Chronic obstructive pulmonary disease, unspecified: Secondary | ICD-10-CM | POA: Diagnosis not present

## 2019-12-30 DIAGNOSIS — I482 Chronic atrial fibrillation, unspecified: Secondary | ICD-10-CM | POA: Diagnosis not present

## 2019-12-30 LAB — CULTURE, BLOOD (ROUTINE X 2)
Culture: NO GROWTH
Culture: NO GROWTH
Special Requests: ADEQUATE

## 2019-12-30 LAB — TRIGLYCERIDES: Triglycerides: 109 mg/dL (ref <150)

## 2019-12-30 LAB — NOVEL CORONAVIRUS, NAA (HOSP ORDER, SEND-OUT TO REF LAB; TAT 18-24 HRS): SARS-CoV-2, NAA: NOT DETECTED

## 2019-12-30 NOTE — Progress Notes (Signed)
Pulmonary Critical Care Medicine [facility] [place]   PULMONARY CRITICAL CARE SERVICE  PROGRESS NOTE  Date of Service: 12/30/2019  Connor Roach  ZOX:[id number]  DOB: 09-16-1960   DOA: 10/30/2019  Referring Physician: Carron Curie, MD  HPI: Connor Roach is a 59 y.o. male seen for follow up of Acute on Chronic Respiratory Failure. Patient currently is on T collar has been on 28% FiO2 good saturations are noted. Secretions are still quite copious. Possible discharge tomorrow  Medications: Reviewed on Rounds  Physical Exam:  Vitals: Temperature 97.8 pulse 76 respiratory rate is 15 blood pressure 114/64 saturations 96%  Ventilator Settings on T collar with an FiO2 28%  . General: Comfortable at this time . Eyes: Grossly normal lids, irises & conjunctiva . ENT: grossly tongue is normal . Neck: no obvious mass . Cardiovascular: S1 S2 normal no gallop . Respiratory: No rhonchi no rales are noted at this time . Abdomen: soft . Skin: no rash seen on limited exam . Musculoskeletal: not rigid . Psychiatric:unable to assess . Neurologic: no seizure no involuntary movements         Lab Data:   Basic Metabolic Panel: Recent Labs  Lab 12/25/19 0659 12/26/19 0434 12/28/19 0604  NA 143 139 142  K 3.9 3.8 3.8  CL 103 101 101  CO2 30 30 33*  GLUCOSE 86 96 95  BUN 9 7 10   CREATININE 0.57* 0.56* 0.59*  CALCIUM 8.3* 8.3* 8.2*  MG 2.0 1.8 2.0  PHOS 3.0 3.7 3.6    ABG: Recent Labs  Lab 12/27/19 0943  PHART 7.419  PCO2ART 51.0*  PO2ART 96.0  HCO3 32.8*  O2SAT 98.2    Liver Function Tests: No results for input(s): AST, ALT, ALKPHOS, BILITOT, PROT, ALBUMIN in the last 168 hours. No results for input(s): LIPASE, AMYLASE in the last 168 hours. Recent Labs  Lab 12/28/19 0604  AMMONIA 45*    CBC: Recent Labs  Lab 12/25/19 0659 12/26/19 0434 12/28/19 0604  WBC 13.1* 9.7 9.8  HGB 9.2* 9.3* 9.6*  HCT 31.2* 30.4* 32.3*  MCV 97.2 95.9 97.0  PLT 300  239 159    Cardiac Enzymes: No results for input(s): CKTOTAL, CKMB, CKMBINDEX, TROPONINI in the last 168 hours.  BNP (last 3 results) No results for input(s): BNP in the last 8760 hours.  ProBNP (last 3 results) No results for input(s): PROBNP in the last 8760 hours.  Radiological Exams: No results found.  Assessment/Plan Active Problems:   Acute on chronic respiratory failure with hypoxia (HCC)   Chronic atrial fibrillation (HCC)   Seizure disorder (HCC)   Metabolic encephalopathy   COPD, severe (HCC)   1. Acute on chronic respiratory failure hypoxia we will continue with T-piece titrate oxygen continue pulmonary toilet patient remains at baseline 2. Chronic atrial fibrillation rate is controlled 3. Seizure disorder no active seizure 4. Metabolic encephalopathy at baseline 5. Severe COPD medical management   I have personally seen and evaluated the patient, evaluated laboratory and imaging results, formulated the assessment and plan and placed orders. The Patient requires high complexity decision making with multiple systems involvement.  Rounds were done with the Respiratory Therapy Director and Staff therapists and discussed with nursing staff also.  13/12/21, MD [facility] Pulmonary Critical Care Medicine Sleep Medicine

## 2020-04-15 DEATH — deceased

## 2021-09-18 IMAGING — DX DG CHEST 1V PORT
1 series · 1 of 1 positions shown · non-contrast
Comparison: 12/05/2019.

CLINICAL DATA: Pneumothorax.

EXAM:
PORTABLE CHEST 1 VIEW

[chest ap]
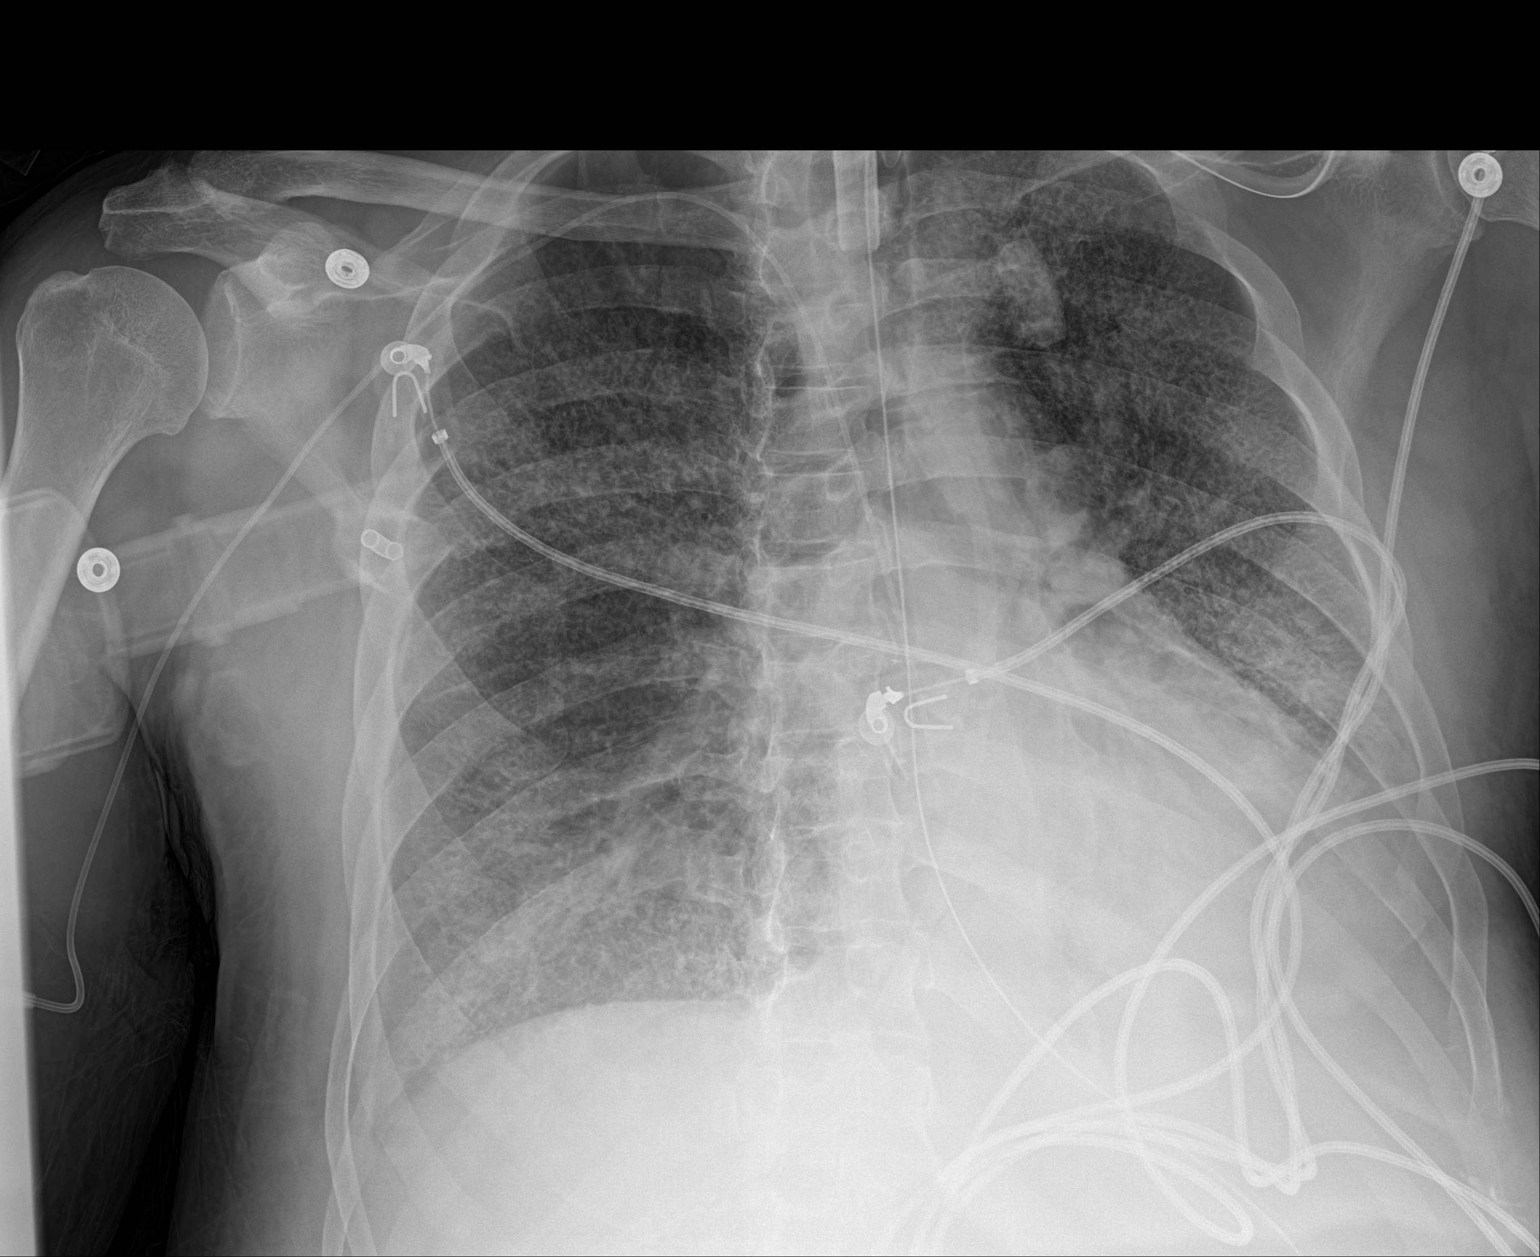

[1 of 1 positions shown; findings below may reference images not displayed]

FINDINGS: Tracheostomy tube, NG tube, right PICC line stable position. Heart
size stable. Diffuse bilateral airspace disease again noted without
interim change. Small left pleural effusion again noted. No
pneumothorax.
IMPRESSION: 1. Lines and tubes in stable position.
2. Diffuse bilateral airspace disease again noted without interim
change. Small left pleural effusion again noted. No pneumothorax.

## 2021-09-18 IMAGING — XA IR REPLACE G-TUBE/COLONIC TUBE
3 series · 3 of 3 positions shown · non-contrast
Comparison: none

INDICATION: 59-year-old male with a history of displaced percutaneous
gastrostomy

[Series 1: fl (-) angio · 1 of 1 slices shown (1 of 3)]
[im 1/1]
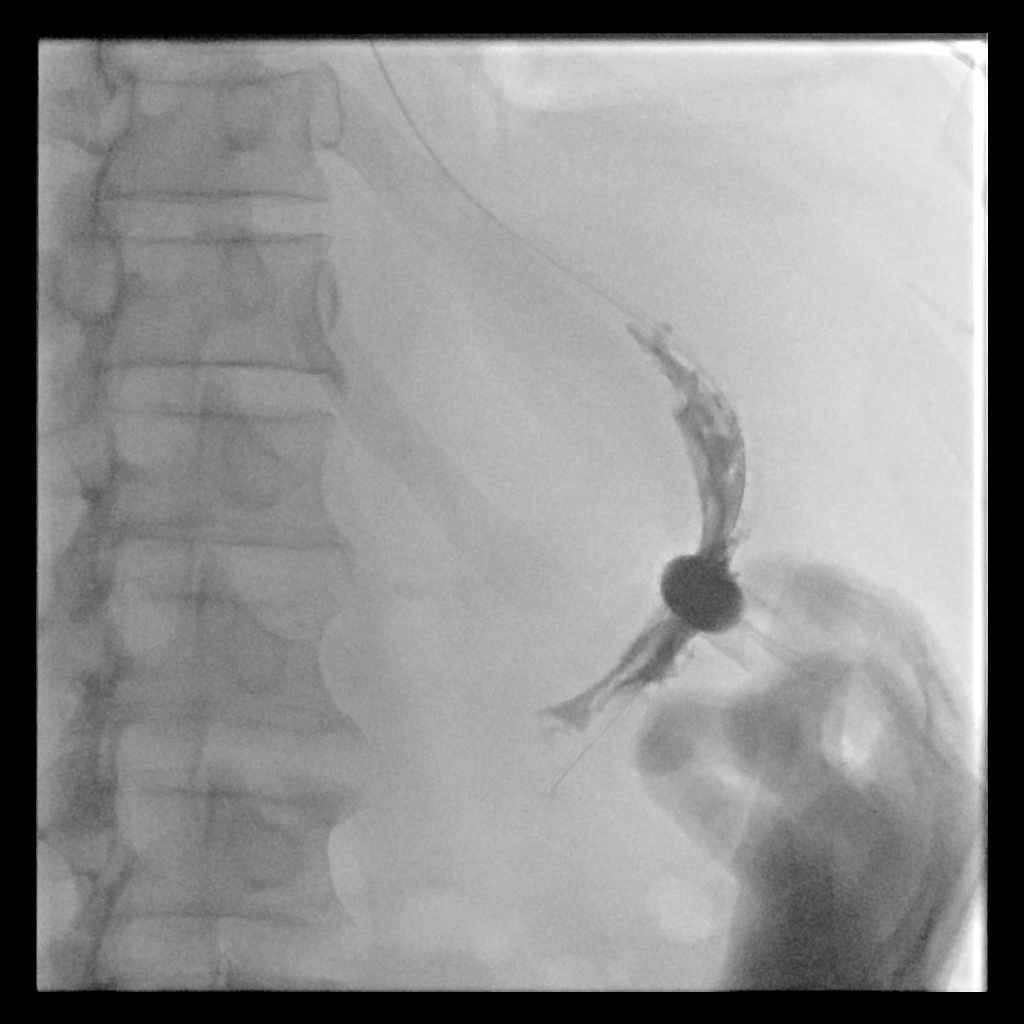

[Series 2: fl (-) angio · 1 of 1 slices shown (2 of 3)]
[im 1/1]
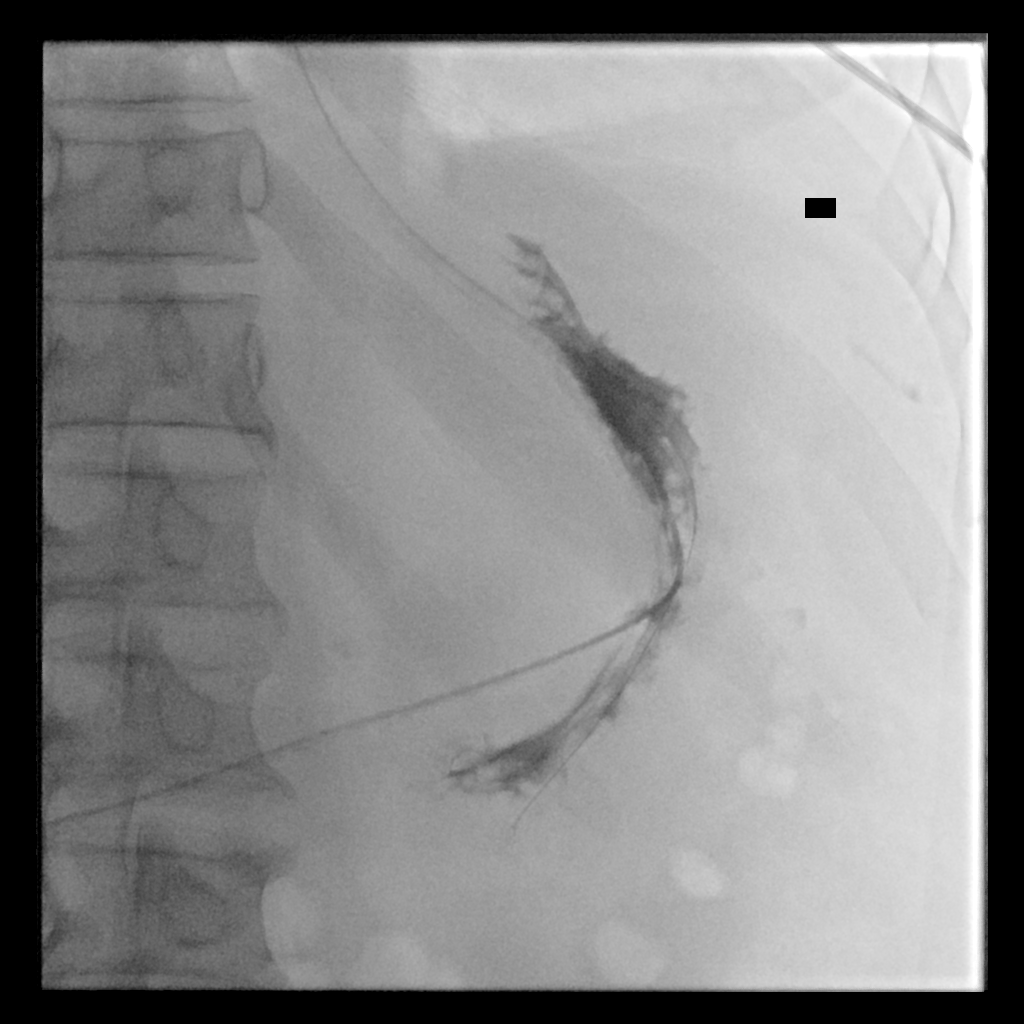

[Series 3: fl (-) angio · 1 of 1 slices shown (3 of 3)]
[im 1/1]
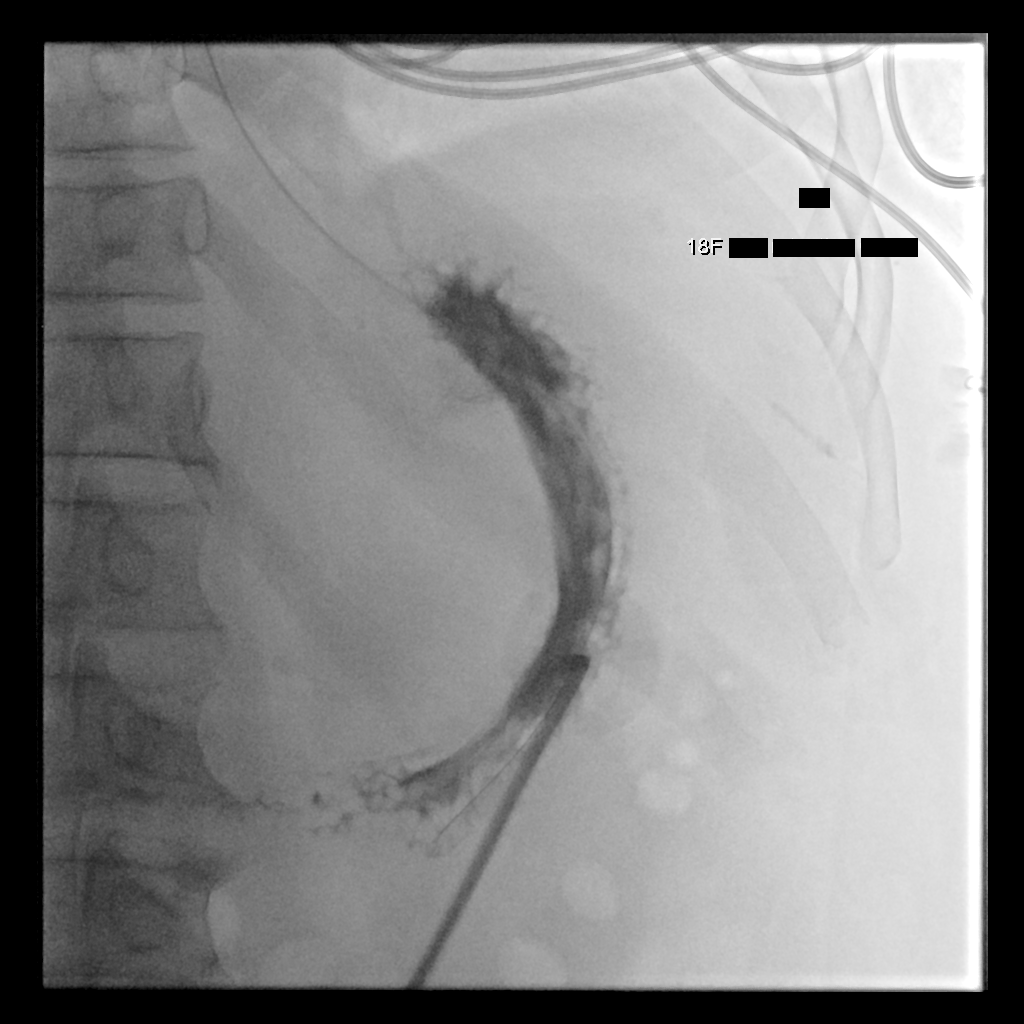

[3 of 3 positions shown; findings below may reference images not displayed]

EXAM:
GASTROSTOMY CATHETER REPLACEMENT

MEDICATIONS:
2 g Ancef; Antibiotics were administered within 1 hour of the
procedure. None

ANESTHESIA/SEDATION:
No moderate sedation

The patient was continuously monitored during the procedure by the
interventional radiology nurse under my direct supervision.

CONTRAST:  10mL OMNIPAQUE IOHEXOL 300 MG/ML SOLN - administered into
the gastric lumen.

FLUOROSCOPY TIME:  Fluoroscopy Time: 2 minutes 0 seconds

COMPLICATIONS:
None

PROCEDURE:
Informed written consent was obtained from the the patient's family
after a thorough discussion of the procedural risks, benefits and
alternatives. All questions were addressed. Maximal Sterile Barrier
Technique was utilized including caps, mask, sterile gowns, sterile
gloves, sterile drape, hand hygiene and skin antiseptic. A timeout
was performed prior to the initiation of the procedure.

The epigastrium was prepped with Betadine in a sterile fashion, and
a sterile drape was applied covering the operative field. A sterile
gown and sterile gloves were used for the procedure.

We removed the ostomy apparatus before the prep and drape procedure.
Contrast was injected through the ostomy, identifying the tract to
the stomach lumen.

A 5 French Kumpe the catheter was navigated into the stomach.

Amplatz wire was placed.

Twelve French dilation and 18 French dilation were performed. We
then elected to place an 18 French balloon retention gastrostomy.
This was advanced on the Amplatz wire into the stomach lumen and 8
cc of saline used to inflate the balloon. Wire was removed.

Contrast injected confirmed location.

Patient tolerated the procedure well and remained hemodynamically
stable throughout.

No complications were encountered and no significant blood loss
encountered.
IMPRESSION: Status post rescue and placement of a new 18 French balloon
retention gastrostomy.

## 2021-09-26 IMAGING — XA IR REPLACE G-TUBE/COLONIC TUBE
1 series · 2 of 2 positions shown · non-contrast
Comparison: none

INDICATION: 59-year-old male with a history of leakage at gastrostomy tube site

[Series 2: fl (-) angio · 2 of 2 slices shown]
[im 1/2]
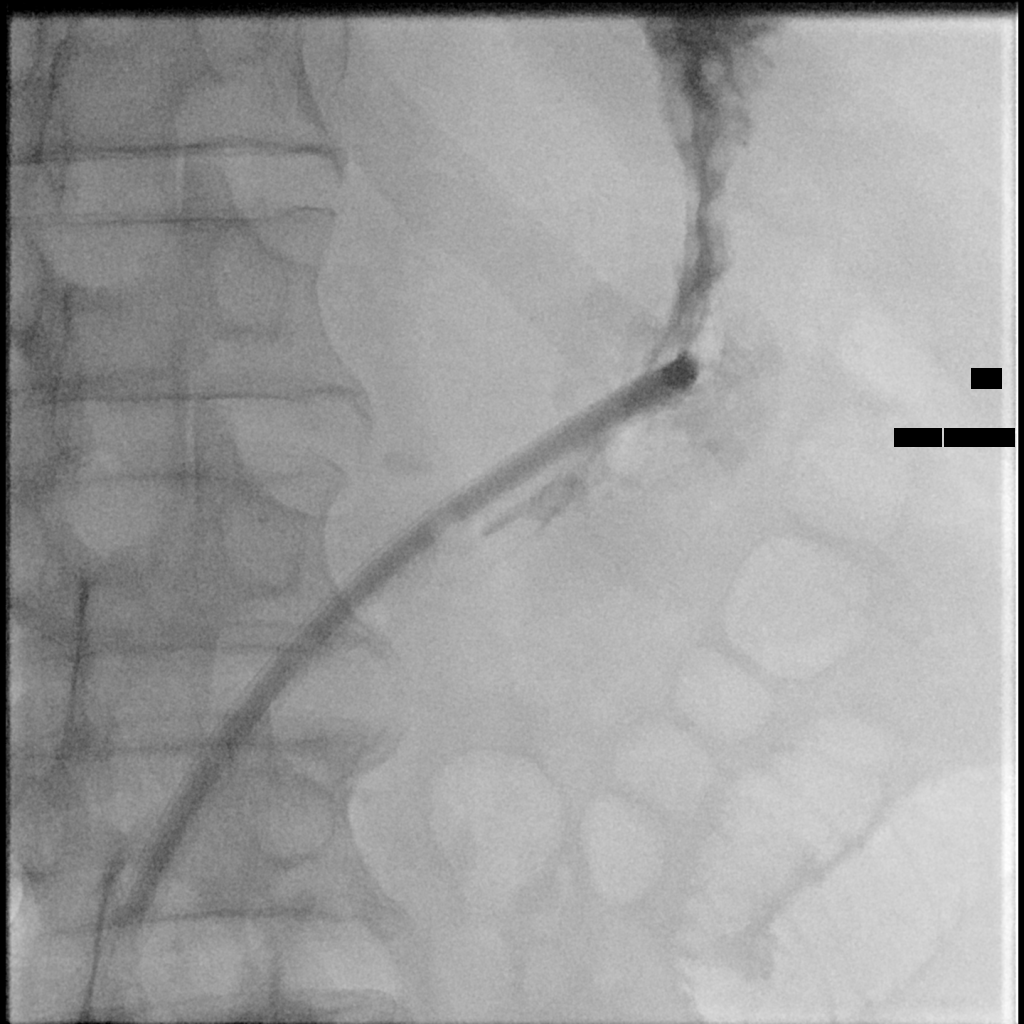
[im 2/2]
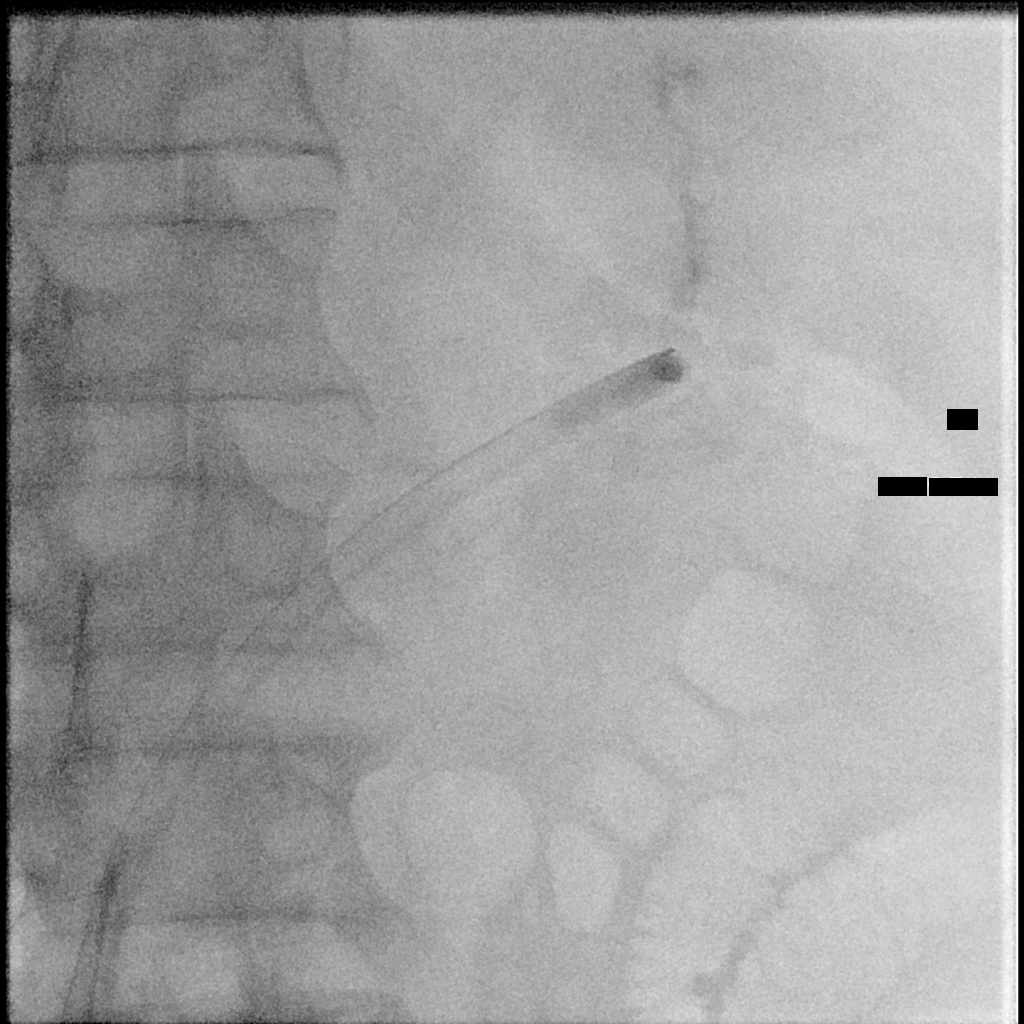

[2 of 2 positions shown; findings below may reference images not displayed]

EXAM:
GASTROSTOMY CATHETER REPLACEMENT

MEDICATIONS:
None

ANESTHESIA/SEDATION:
None

CONTRAST:  8mL OMNIPAQUE IOHEXOL 300 MG/ML SOLN - administered into
the gastric lumen.

FLUOROSCOPY TIME:  Fluoroscopy Time: 0 minutes 6 seconds (1 mGy).

COMPLICATIONS:
None

PROCEDURE:
Informed consent was presumed from given prior discussion with the
family and the presence of the gastrostomy tube, which we have
placed previously. All questions were addressed. Maximal Sterile
Barrier Technique was utilized including caps, mask, sterile gowns,
sterile gloves, sterile drape, hand hygiene and skin antiseptic. A
timeout was performed prior to the initiation of the procedure.

Patient was positioned on the fluoroscopy table. Contrast was
injected confirming position.

The balloon was deflated and the old 18 French tube was exchanged
for a new 22 French gastrostomy tube. Balloon was inflated with 8 cc
of saline and the flange was tightened at the abdominal wall. Dry
dressing was placed.

Final image was stored confirmed position in the stomach lumen.

Patient tolerated the procedure well and remained hemodynamically
stable throughout.

No complications were encountered and no significant blood loss
encountered.
IMPRESSION: Status post fluoroscopic placed percutaneous gastrostomy tube, with
new 22 French gastrostomy.

## 2023-08-16 DEATH — deceased
